# Patient Record
Sex: Female | Born: 1965 | Race: White | Hispanic: No | Smoking: Former smoker
Health system: Southern US, Community
[De-identification: ages and names within clinical notes are randomized; demographics above are authoritative.]

## PROBLEM LIST (undated history)

## (undated) DIAGNOSIS — IMO0002 Reserved for concepts with insufficient information to code with codable children: Secondary | ICD-10-CM

## (undated) DIAGNOSIS — F419 Anxiety disorder, unspecified: Secondary | ICD-10-CM

## (undated) DIAGNOSIS — K219 Gastro-esophageal reflux disease without esophagitis: Secondary | ICD-10-CM

## (undated) DIAGNOSIS — M1990 Unspecified osteoarthritis, unspecified site: Secondary | ICD-10-CM

## (undated) DIAGNOSIS — F32A Depression, unspecified: Secondary | ICD-10-CM

## (undated) DIAGNOSIS — D649 Anemia, unspecified: Secondary | ICD-10-CM

## (undated) DIAGNOSIS — H269 Unspecified cataract: Secondary | ICD-10-CM

## (undated) DIAGNOSIS — T7840XA Allergy, unspecified, initial encounter: Secondary | ICD-10-CM

## (undated) DIAGNOSIS — R011 Cardiac murmur, unspecified: Secondary | ICD-10-CM

## (undated) DIAGNOSIS — F1910 Other psychoactive substance abuse, uncomplicated: Secondary | ICD-10-CM

## (undated) HISTORY — DX: Unspecified cataract: H26.9

## (undated) HISTORY — DX: Reserved for concepts with insufficient information to code with codable children: IMO0002

## (undated) HISTORY — DX: Cardiac murmur, unspecified: R01.1

## (undated) HISTORY — DX: Other psychoactive substance abuse, uncomplicated: F19.10

## (undated) HISTORY — DX: Allergy, unspecified, initial encounter: T78.40XA

## (undated) HISTORY — PX: ABDOMINAL HYSTERECTOMY: SHX81

---

## 1999-07-04 ENCOUNTER — Encounter: Payer: Self-pay | Admitting: Emergency Medicine

## 1999-07-04 ENCOUNTER — Inpatient Hospital Stay (HOSPITAL_COMMUNITY): Admission: EM | Admit: 1999-07-04 | Discharge: 1999-07-08 | Payer: Self-pay | Admitting: Emergency Medicine

## 1999-07-05 ENCOUNTER — Encounter: Payer: Self-pay | Admitting: Gastroenterology

## 1999-07-06 ENCOUNTER — Encounter: Payer: Self-pay | Admitting: Gastroenterology

## 1999-07-20 ENCOUNTER — Encounter: Admission: RE | Admit: 1999-07-20 | Discharge: 1999-07-20 | Payer: Self-pay | Admitting: Gastroenterology

## 1999-07-20 ENCOUNTER — Encounter: Payer: Self-pay | Admitting: Gastroenterology

## 2001-08-12 ENCOUNTER — Encounter: Payer: Self-pay | Admitting: Emergency Medicine

## 2001-08-13 ENCOUNTER — Inpatient Hospital Stay (HOSPITAL_COMMUNITY): Admission: EM | Admit: 2001-08-13 | Discharge: 2001-08-15 | Payer: Self-pay | Admitting: Emergency Medicine

## 2007-04-10 ENCOUNTER — Emergency Department (HOSPITAL_COMMUNITY): Admission: EM | Admit: 2007-04-10 | Discharge: 2007-04-10 | Payer: Self-pay | Admitting: Emergency Medicine

## 2007-05-06 ENCOUNTER — Emergency Department (HOSPITAL_COMMUNITY): Admission: EM | Admit: 2007-05-06 | Discharge: 2007-05-06 | Payer: Self-pay | Admitting: Emergency Medicine

## 2007-06-12 ENCOUNTER — Ambulatory Visit (HOSPITAL_COMMUNITY): Admission: RE | Admit: 2007-06-12 | Discharge: 2007-06-12 | Payer: Self-pay | Admitting: Surgery

## 2007-06-12 ENCOUNTER — Encounter (INDEPENDENT_AMBULATORY_CARE_PROVIDER_SITE_OTHER): Payer: Self-pay | Admitting: Surgery

## 2008-04-20 ENCOUNTER — Encounter: Admission: RE | Admit: 2008-04-20 | Discharge: 2008-04-20 | Payer: Self-pay | Admitting: Family Medicine

## 2009-08-16 ENCOUNTER — Emergency Department (HOSPITAL_COMMUNITY): Admission: EM | Admit: 2009-08-16 | Discharge: 2009-08-17 | Payer: Self-pay | Admitting: Emergency Medicine

## 2009-09-08 ENCOUNTER — Emergency Department (HOSPITAL_COMMUNITY): Admission: EM | Admit: 2009-09-08 | Discharge: 2009-09-08 | Payer: Self-pay | Admitting: Family Medicine

## 2010-04-21 LAB — POCT URINALYSIS DIPSTICK
Ketones, ur: 15 mg/dL — AB
Nitrite: NEGATIVE
Specific Gravity, Urine: >=1.03 (ref 1.005–1.030)
Urobilinogen, UA: 0.2 mg/dL (ref 0.0–1.0)
pH: 5 (ref 5.0–8.0)

## 2010-04-23 LAB — URINALYSIS, ROUTINE W REFLEX MICROSCOPIC: Nitrite: NEGATIVE

## 2010-06-20 NOTE — Op Note (Signed)
Shawna Mcbride, Shawna Mcbride                 ACCOUNT NO.:  [id number]   MEDICAL RECORD NO.:  [id number]          PATIENT TYPE:  AMB   LOCATION:  DAY                          FACILITY:  [facility]   PHYSICIAN:  Sandria Bales. Ezzard Standing, M.D.  DATE OF BIRTH:  01/06/1966   DATE OF PROCEDURE:  DATE OF DISCHARGE:                               OPERATIVE REPORT   Date of Surgery ???   PREOPERATIVE DIAGNOSIS:  Gallbladder sludge with thickened gallbladder  wall.   POSTOPERATIVE DIAGNOSIS:  Significant inflammatory mass around  pylorus/duodenum with large pyloric channel ulcer.  No acute disease of  gallbladder.   OPERATION/PROCEDURE:  1. Diagnostic laparoscopy with biopsy of anterior wall of stomach and      peritoneum, peritoneal washings.  2. Upper endoscopy.   SURGEON:  Sandria Bales. Ezzard Standing, M.D.   ASSISTANT:  Anselm Pancoast. Zachery Dakins, M.D.   ANESTHESIA:  General endotracheal anesthesia.   ESTIMATED BLOOD LOSS:  Minimal.   INDICATIONS:  Shawna Mcbride is a 45 year old white female who is a patient  of Dr. Maurice Small who had gone to the emergency room  for some right  upper quadrant pain. An ultrasound at that time showed some possible  gall bladder sludge.  Significantly, the patient had a prior history of  peptic ulcer disease diagnosed over 10 years ago by Dr. Charna Elizabeth.  She takes over-the-counter Pepcid, but did not seem to have any clear  ulcer symptoms coincidental with her right upper quadrant pain.   I discussed with her gallbladder surgery and potential complications.  Potential complications of gallbladder surgery include, but are not  limited to, bleeding, infection, bile duct injury, and open surgery.   DESCRIPTION OF PROCEDURE:  The patient was placed in the supine  position, given a general endotracheal anesthetic. Her abdomen was  prepped with Techni-Care and sterilely draped.  A time out was held  identifying the patient and the procedure.   An infraumbilical incision was made with sharp  dissection and carried  down to the abdominal cavity.  I placed a 10 mm laparoscope through a 12  mm Hasson trocar was secured with a 0 Vicryl suture.  I placed a 5 mm  right lateral trocar as my only port.   She clearly had something going on other than gall bladder disease.  She  had an inflammatory mass around her distal pylorus and proximal  duodenum.  It was difficult to tell from the outside whether this was a  malignant change or some inflammatory change.  She has some nodules  along the anterior wall of her stomach which looked like saponification.  I biopsied of one of the nodules along the anterior wall of the stomach  and Dr. Baruch Merl thought it was giant cell reaction.  There was no  evidence of malignancy consistent with probably inflammatory changes.  A  second biopsy of the peritoneum on the edge of the liver were sent for  permanent and I also did the peritoneal washings.  I explored her  abdomen and right and left lobes of the liver were unremarkable.  The  gallbladder that I could see was actually unremarkable though the neck  of the gallbladder went down towards this inflammatory mass.  I actually  could see a lot of her bowel without much difficulty.  I looked down in  her pelvis.  I was able to see her right tube and ovary.  Her uterus is  absent.  She has no peritoneal nodules, mass or inflammation or fluid.   It was my feeling that the inflammatory mass probably represented  significant (but under diagnosed) peptic ulcer disease.   I broke scrub and did upper endoscopy where I passed the flexible  Olympus endoscope without difficulty into the stomach.  In her distal  stomach in the antrum towards the pylorus, she has a very enlarged,  probably 2+ cm ulcer on the antral wall of the stomach.  Immediately  proximal to that shows a small ulcer, maybe around 1 cm in size.  These  ulcers had a benign appearance, I think goes along with her poorly  treated or untreated  ulcer disease and her continued smoking.   I thought there was no reason to try to doing any gallbladder surgery  today.  Her gallbladder is not acutely inflamed.  There would be  significant risk with the surgery in trying to dissect this out in the  face of the inflammation.  I think what she needs is intense medical  therapy with followup from the GI doctor for the ulcer disease. If this  responds to medical therapy, then we can approach her gallbladder at a  later date.  If it does not respond to therapy, she may well need  surgery to control ulcer disease.   I will explain all this to her postoperatively.  I then removed the  trocars.  I closed the umbilical incision with 0 Vicryl suture, closed  the skin incision with 5-0 Monocryl suture. The abdomen was closed with  Benzoin and Steri-Strips.  The patient tolerated the procedure well and  was transferred to recovery in good condition.      Sandria Bales. Ezzard Standing, M.D.  Electronically Signed     DHN/MEDQ  D:  06/12/2007  T:  06/12/2007  Job:  [id number]   cc:   Gretta Arab. Valentina Lucks, M.D.  Fax: [phone]   WGNFAO ZHY QMVH, M.D.  Fax: [phone]
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
## 2010-06-23 NOTE — Discharge Summary (Signed)
[place]  Patient:    Shawna Mcbride, Shawna Mcbride                        MRN: [id number] Adm. Date:  [id number] Disc. Date: [id number] Attending:  Charna Elizabeth CC:         Anselmo Rod, M.D.             Velora Heckler, M.D.                           Discharge Summary  ADMITTING DIAGNOSES: 1. Partial small bowel obstruction (distal). 2. History of duodenal ulcer in the past. 3. History of right ovarian cyst. 4. Status post vaginal hysterectomy in 1997, for dysfunctional uterine    bleeding.  DISCHARGE DIAGNOSES: 1. Partial small bowel obstruction resolved. 2. History of duodenal ulcer. 3. History of right ovarian cyst. 4. Status post vaginal hysterectomy in 1997, for dysfunctional uterine    bleeding.  HOSPITAL COURSE:  Ms. [name] is a 45 year old white female who was admitted to [facility] through the emergency room on Jul 04, 1999, when she presented via ambulance with severe abdominal pain associated with nausea and vomiting.  The patient was evaluated in the emergency room and was found to have distal small bowel obstruction.  She also complained of constipation at the time.  She was admitted and NG tube was placed.  She was started on IV fluids and electrolytes were closely monitored.  Serial abdominal films were done and the patient was followed closely through the admission.  She was seen in consultation by Dr. Darnell Level on Jul 06, 1999, for possible surgery if symptoms not improved within the next 24-48 hours. However, the patient did well and was discharged home on Jul 06, 1999. For details of admission, please see History and Physical.  FOLLOWUP:  Follow up in the office in the next week and to call the office emergently if she has recurrence of her symptoms.  DIET:  Soft diet is recommended and gradually advance as tolerated. DD:  08/15/99 TD:  08/15/99 Job: [id number]

## 2010-08-10 ENCOUNTER — Inpatient Hospital Stay (INDEPENDENT_AMBULATORY_CARE_PROVIDER_SITE_OTHER)
Admission: RE | Admit: 2010-08-10 | Discharge: 2010-08-10 | Disposition: A | Discharge status: Home / Self Care | Payer: Self-pay | Source: Ambulatory Visit | Attending: Family Medicine | Admitting: Family Medicine

## 2010-08-10 DIAGNOSIS — J029 Acute pharyngitis, unspecified: Secondary | ICD-10-CM

## 2010-08-10 LAB — POCT RAPID STREP A: Streptococcus, Group A Screen (Direct): NEGATIVE

## 2010-10-30 LAB — URINALYSIS, ROUTINE W REFLEX MICROSCOPIC
Bilirubin Urine: NEGATIVE
Glucose, UA: NEGATIVE
Hgb urine dipstick: NEGATIVE
Ketones, ur: >80 — AB
Ketones, ur: NEGATIVE
Nitrite: NEGATIVE
Protein, ur: NEGATIVE
Protein, ur: NEGATIVE
Specific Gravity, Urine: 1.027
Specific Gravity, Urine: 1.013
Urobilinogen, UA: 0.2
Urobilinogen, UA: 0.2
pH: 5.5

## 2010-10-30 LAB — LIPASE, BLOOD: Lipase: 13

## 2010-10-30 LAB — DIFFERENTIAL
Basophils Relative: 1
Eosinophils Absolute: 0.1
Eosinophils Relative: 1
Lymphs Abs: 1.1

## 2010-10-30 LAB — COMPREHENSIVE METABOLIC PANEL
AST: 24
Albumin: 3.5
Alkaline Phosphatase: 70
BUN: 13
CO2: 28
Chloride: 99
GFR calc Af Amer: >60
Potassium: 4.3
Total Bilirubin: 0.5

## 2010-10-30 LAB — CBC
MCV: 91.4
Platelets: 348

## 2011-01-01 ENCOUNTER — Inpatient Hospital Stay (HOSPITAL_COMMUNITY)
Admission: EM | Admit: 2011-01-01 | Discharge: 2011-01-04 | DRG: 378 | Disposition: A | Discharge status: Home / Self Care | Payer: Self-pay | Source: Ambulatory Visit | Attending: Internal Medicine | Admitting: Internal Medicine

## 2011-01-01 ENCOUNTER — Other Ambulatory Visit: Payer: Self-pay

## 2011-01-01 ENCOUNTER — Encounter: Payer: Self-pay | Admitting: *Deleted

## 2011-01-01 DIAGNOSIS — D500 Iron deficiency anemia secondary to blood loss (chronic): Secondary | ICD-10-CM | POA: Diagnosis present

## 2011-01-01 DIAGNOSIS — K922 Gastrointestinal hemorrhage, unspecified: Secondary | ICD-10-CM

## 2011-01-01 DIAGNOSIS — K254 Chronic or unspecified gastric ulcer with hemorrhage: Principal | ICD-10-CM | POA: Diagnosis present

## 2011-01-01 DIAGNOSIS — F17200 Nicotine dependence, unspecified, uncomplicated: Secondary | ICD-10-CM | POA: Diagnosis present

## 2011-01-01 DIAGNOSIS — M129 Arthropathy, unspecified: Secondary | ICD-10-CM | POA: Diagnosis present

## 2011-01-01 DIAGNOSIS — D696 Thrombocytopenia, unspecified: Secondary | ICD-10-CM | POA: Diagnosis present

## 2011-01-01 DIAGNOSIS — D62 Acute posthemorrhagic anemia: Secondary | ICD-10-CM | POA: Diagnosis present

## 2011-01-01 DIAGNOSIS — D72829 Elevated white blood cell count, unspecified: Secondary | ICD-10-CM | POA: Diagnosis present

## 2011-01-01 DIAGNOSIS — E876 Hypokalemia: Secondary | ICD-10-CM | POA: Diagnosis present

## 2011-01-01 DIAGNOSIS — Z79899 Other long term (current) drug therapy: Secondary | ICD-10-CM

## 2011-01-01 DIAGNOSIS — Z681 Body mass index (BMI) 19 or less, adult: Secondary | ICD-10-CM

## 2011-01-01 HISTORY — DX: Unspecified osteoarthritis, unspecified site: M19.90

## 2011-01-01 LAB — OCCULT BLOOD, POC DEVICE: Fecal Occult Bld: POSITIVE

## 2011-01-01 MED ORDER — ONDANSETRON HCL 4 MG/2ML IJ SOLN
4.0000 mg | Freq: Once | INTRAMUSCULAR | Status: AC
  Administered 2011-01-01: 4 mg via INTRAVENOUS
  Filled 2011-01-01: qty 2

## 2011-01-01 MED ORDER — SODIUM CHLORIDE 0.9 % IV BOLUS (SEPSIS)
1000.0000 mL | Freq: Once | INTRAVENOUS | Status: AC
  Administered 2011-01-02: 1000 mL via INTRAVENOUS

## 2011-01-01 MED ORDER — SODIUM CHLORIDE 0.9 % IV SOLN
8.0000 mg/h | INTRAVENOUS | Status: AC
  Administered 2011-01-02: 8 mg/h via INTRAVENOUS
  Filled 2011-01-01: qty 80

## 2011-01-01 MED ORDER — SODIUM CHLORIDE 0.9 % IV SOLN
80.0000 mg | INTRAVENOUS | Status: AC
  Administered 2011-01-02: 80 mg via INTRAVENOUS
  Filled 2011-01-01: qty 80

## 2011-01-01 MED ORDER — SODIUM CHLORIDE 0.9 % IV BOLUS (SEPSIS)
1000.0000 mL | Freq: Once | INTRAVENOUS | Status: AC
  Administered 2011-01-01: 1000 mL via INTRAVENOUS

## 2011-01-01 NOTE — ED Notes (Signed)
Pt undressed and placed in gown. Pt is on cardiac monitor, pulse ox, and bp cuff

## 2011-01-01 NOTE — ED Notes (Signed)
Patient presents with c/o dark tarry stool, +vomiting, possible syncopal episode.  IV right AC 20gauge with NS

## 2011-01-01 NOTE — ED Provider Notes (Signed)
This 45 year old female has new onset upper GI bleeding with hematemesis of maroon emesis and dark stool just this evening with no abdominal pain or tenderness.  Hurman Horn, MD 01/02/11 [phone]

## 2011-01-01 NOTE — ED Notes (Signed)
Patient stated she felt weak all all day.  She went to the BR and had a loose tarry stool, and on the way back to her bed she had a syncopal episode and remembers her son helping up to the bed.  Then she began vomiting dark bloody emesis with streaks of bright red blood.  States she ha a history of ulcers but has not had any trouble with them for about 2 years.

## 2011-01-02 ENCOUNTER — Encounter (HOSPITAL_COMMUNITY)
Admission: EM | Disposition: A | Discharge status: Home / Self Care | Payer: Self-pay | Source: Ambulatory Visit | Attending: Internal Medicine

## 2011-01-02 ENCOUNTER — Other Ambulatory Visit: Payer: Self-pay | Admitting: Gastroenterology

## 2011-01-02 ENCOUNTER — Encounter (HOSPITAL_COMMUNITY): Payer: Self-pay | Admitting: *Deleted

## 2011-01-02 DIAGNOSIS — D500 Iron deficiency anemia secondary to blood loss (chronic): Secondary | ICD-10-CM | POA: Diagnosis present

## 2011-01-02 DIAGNOSIS — D72829 Elevated white blood cell count, unspecified: Secondary | ICD-10-CM | POA: Diagnosis present

## 2011-01-02 HISTORY — PX: ESOPHAGOGASTRODUODENOSCOPY: SHX5428

## 2011-01-02 LAB — CBC
HCT: 31.6 % — ABNORMAL LOW (ref 36.0–46.0)
HCT: 27.1 % — ABNORMAL LOW (ref 36.0–46.0)
HCT: 27.2 % — ABNORMAL LOW (ref 36.0–46.0)
HCT: 23.9 % — ABNORMAL LOW (ref 36.0–46.0)
Hemoglobin: 10.6 g/dL — ABNORMAL LOW (ref 12.0–15.0)
Hemoglobin: 7.9 g/dL — ABNORMAL LOW (ref 12.0–15.0)
MCH: 31.3 pg (ref 26.0–34.0)
MCH: 31 pg (ref 26.0–34.0)
MCHC: 33.5 g/dL (ref 30.0–36.0)
MCHC: 33.1 g/dL (ref 30.0–36.0)
MCV: 93.2 fL (ref 78.0–100.0)
MCV: 93.5 fL (ref 78.0–100.0)
MCV: 94.4 fL (ref 78.0–100.0)
Platelets: 197 10*3/uL (ref 150–400)
Platelets: 169 10*3/uL (ref 150–400)
Platelets: 173 10*3/uL (ref 150–400)
RBC: 3.39 MIL/uL — ABNORMAL LOW (ref 3.87–5.11)
RBC: 2.87 MIL/uL — ABNORMAL LOW (ref 3.87–5.11)
RBC: 2.91 MIL/uL — ABNORMAL LOW (ref 3.87–5.11)
WBC: 6.4 10*3/uL (ref 4.0–10.5)

## 2011-01-02 LAB — PROTIME-INR
INR: 1.08 (ref 0.00–1.49)
INR: 1.02 (ref 0.00–1.49)

## 2011-01-02 LAB — COMPREHENSIVE METABOLIC PANEL
AST: 10 U/L (ref 0–37)
AST: 12 U/L (ref 0–37)
Albumin: 3 g/dL — ABNORMAL LOW (ref 3.5–5.2)
Alkaline Phosphatase: 37 U/L — ABNORMAL LOW (ref 39–117)
Alkaline Phosphatase: 8 U/L — ABNORMAL LOW (ref 39–117)
BUN: 48 mg/dL — ABNORMAL HIGH (ref 6–23)
CO2: 19 mEq/L (ref 19–32)
Calcium: UNDETERMINED mg/dL (ref 8.4–10.5)
Chloride: 101 mEq/L (ref 96–112)
Chloride: 111 mEq/L (ref 96–112)
Creatinine, Ser: 0.51 mg/dL (ref 0.50–1.10)
Glucose, Bld: 86 mg/dL (ref 70–99)
Potassium: 3.5 mEq/L (ref 3.5–5.1)
Total Bilirubin: 0.2 mg/dL — ABNORMAL LOW (ref 0.3–1.2)
Total Bilirubin: 0.3 mg/dL (ref 0.3–1.2)
Total Protein: 5.4 g/dL — ABNORMAL LOW (ref 6.0–8.3)
Total Protein: 6.1 g/dL (ref 6.0–8.3)

## 2011-01-02 LAB — DIFFERENTIAL
Eosinophils Absolute: 0.1 10*3/uL (ref 0.0–0.7)
Lymphs Abs: 1.8 10*3/uL (ref 0.7–4.0)

## 2011-01-02 LAB — ABO/RH: ABO/RH(D): B POS

## 2011-01-02 LAB — MAGNESIUM: Magnesium: 1.8 mg/dL (ref 1.5–2.5)

## 2011-01-02 LAB — LACTIC ACID, PLASMA: Lactic Acid, Venous: 0.6 mmol/L (ref 0.5–2.2)

## 2011-01-02 SURGERY — EGD (ESOPHAGOGASTRODUODENOSCOPY)
Anesthesia: Moderate Sedation

## 2011-01-02 MED ORDER — ACETAMINOPHEN 650 MG RE SUPP: 650.0000 mg | Freq: Four times a day (QID) | RECTAL | Status: DC | PRN

## 2011-01-02 MED ORDER — ONDANSETRON HCL 4 MG/2ML IJ SOLN
4.0000 mg | Freq: Four times a day (QID) | INTRAMUSCULAR | Status: DC | PRN
  Administered 2011-01-02: 4 mg via INTRAVENOUS
  Filled 2011-01-02: qty 2

## 2011-01-02 MED ORDER — FENTANYL NICU IV SYRINGE 50 MCG/ML
INJECTION | INTRAMUSCULAR | Status: DC | PRN
  Administered 2011-01-02 (×3): 25 ug via INTRAVENOUS

## 2011-01-02 MED ORDER — SENNOSIDES-DOCUSATE SODIUM 8.6-50 MG PO TABS
1.0000 | ORAL_TABLET | Freq: Every day | ORAL | Status: DC | PRN
Start: 2011-01-02 — End: 2011-01-04
  Filled 2011-01-02: qty 1

## 2011-01-02 MED ORDER — DIPHENHYDRAMINE HCL 50 MG/ML IJ SOLN
INTRAMUSCULAR | Status: DC | PRN
  Administered 2011-01-02 (×2): 25 mg via INTRAVENOUS

## 2011-01-02 MED ORDER — HYDROCODONE-ACETAMINOPHEN 5-325 MG PO TABS
1.0000 | ORAL_TABLET | ORAL | Status: DC | PRN
  Administered 2011-01-02: 1 via ORAL
  Administered 2011-01-02 – 2011-01-03 (×6): 2 via ORAL
  Administered 2011-01-04 (×2): 1 via ORAL
  Filled 2011-01-02 (×3): qty 2
  Filled 2011-01-02: qty 1
  Filled 2011-01-02 (×4): qty 2
  Filled 2011-01-02: qty 1

## 2011-01-02 MED ORDER — ACETAMINOPHEN 325 MG PO TABS: 650.0000 mg | ORAL_TABLET | Freq: Four times a day (QID) | ORAL | Status: DC | PRN

## 2011-01-02 MED ORDER — ENOXAPARIN SODIUM 40 MG/0.4ML ~~LOC~~ SOLN: 40.0000 mg | SUBCUTANEOUS | Status: DC

## 2011-01-02 MED ORDER — HYDROCODONE-ACETAMINOPHEN 5-325 MG PO TABS
1.0000 | ORAL_TABLET | Freq: Once | ORAL | Status: AC
  Administered 2011-01-02: 1 via ORAL
  Filled 2011-01-02: qty 1

## 2011-01-02 MED ORDER — FENTANYL CITRATE 0.05 MG/ML IJ SOLN
INTRAMUSCULAR | Status: AC
  Filled 2011-01-02: qty 2

## 2011-01-02 MED ORDER — PANTOPRAZOLE SODIUM 40 MG IV SOLR
INTRAVENOUS | Status: AC
  Filled 2011-01-02: qty 80

## 2011-01-02 MED ORDER — ONDANSETRON HCL 4 MG PO TABS
4.0000 mg | ORAL_TABLET | Freq: Four times a day (QID) | ORAL | Status: DC | PRN
  Administered 2011-01-03 (×2): 4 mg via ORAL
  Filled 2011-01-02 (×2): qty 1

## 2011-01-02 MED ORDER — MIDAZOLAM HCL 10 MG/2ML IJ SOLN
INTRAMUSCULAR | Status: AC
  Filled 2011-01-02: qty 2

## 2011-01-02 MED ORDER — NICOTINE 21 MG/24HR TD PT24
21.0000 mg | MEDICATED_PATCH | Freq: Every day | TRANSDERMAL | Status: DC
  Administered 2011-01-02 – 2011-01-04 (×4): 21 mg via TRANSDERMAL
  Filled 2011-01-02 (×3): qty 1

## 2011-01-02 MED ORDER — POLYETHYLENE GLYCOL 3350 17 G PO PACK
17.0000 g | PACK | Freq: Every day | ORAL | Status: DC
  Administered 2011-01-03 – 2011-01-04 (×2): 17 g via ORAL
  Filled 2011-01-02 (×4): qty 1

## 2011-01-02 MED ORDER — PANTOPRAZOLE SODIUM 40 MG PO TBEC
40.0000 mg | DELAYED_RELEASE_TABLET | Freq: Every day | ORAL | Status: DC
  Administered 2011-01-02 – 2011-01-04 (×3): 40 mg via ORAL
  Filled 2011-01-02 (×5): qty 1

## 2011-01-02 MED ORDER — MIDAZOLAM HCL 10 MG/2ML IJ SOLN
INTRAMUSCULAR | Status: DC | PRN
  Administered 2011-01-02 (×2): 2 mg via INTRAVENOUS
  Administered 2011-01-02 (×2): 1 mg via INTRAVENOUS

## 2011-01-02 MED ORDER — SUCRALFATE 1 G PO TABS
1.0000 g | ORAL_TABLET | Freq: Three times a day (TID) | ORAL | Status: DC
  Administered 2011-01-02 – 2011-01-04 (×8): 1 g via ORAL
  Filled 2011-01-02 (×10): qty 1

## 2011-01-02 MED ORDER — DIPHENHYDRAMINE HCL 50 MG/ML IJ SOLN
INTRAMUSCULAR | Status: AC
  Filled 2011-01-02: qty 1

## 2011-01-02 MED ORDER — BUTAMBEN-TETRACAINE-BENZOCAINE 2-2-14 % EX AERO
INHALATION_SPRAY | CUTANEOUS | Status: DC | PRN
  Administered 2011-01-02 (×2): 1 via TOPICAL

## 2011-01-02 MED ORDER — SODIUM CHLORIDE 0.9 % IV SOLN
INTRAVENOUS | Status: DC
  Administered 2011-01-02 (×2): via INTRAVENOUS

## 2011-01-02 NOTE — ED Provider Notes (Signed)
I saw and evaluated the patient, reviewed the resident's note and Pt's ECG and I agree with the findings and plan.  Hurman Horn, MD 01/02/11 [phone]

## 2011-01-02 NOTE — Progress Notes (Signed)
01/02/2011 Shawna Mcbride Case Management Note [phone]       45yo female patient admitted on 01/01/11 with c/o dark stools and hematemesis. Only history is arthritis. In to speak with patient. Prior to admission, patient lived at home with adult son. Independent with ADLs. No discharge needs noted at present. CM will continue to monitor.

## 2011-01-02 NOTE — Progress Notes (Signed)
Phone to Dr. Verta Ellen.  Notified that Dr. Kirby Funk is not pts MD, instead his wife Maurice Small, MD, and he request service be changed to hospitalist for coverage. Dr. Verta Ellen reported she would facilitate change in system.

## 2011-01-02 NOTE — Progress Notes (Addendum)
Subjective: No further episodes of hematemesis or melena during this hospitalization, blood pressure continues to be low  Objective: Vital signs in last 24 hours: Filed Vitals:   01/02/11 0218 01/02/11 0530 01/02/11 0700 01/02/11 1000  BP: 88/54 97/66  98/59  Pulse: 67 56  59  Temp: 97.3 F (36.3 C) 97.8 F (36.6 C)  98.3 F (36.8 C)  TempSrc: Oral Oral  Oral  Resp: 20 18  18   Height:   5\' 7"  (1.702 m)   Weight:   52.1 kg (114 lb 13.8 oz)   SpO2: 100% 100%  97%    Intake/Output Summary (Last 24 hours) at 01/02/11 1118 Last data filed at 01/02/11 0900  Gross per 24 hour  Intake    240 ml  Output      0 ml  Net    240 ml    Weight change:   General: Alert, awake, oriented x3, in no acute distress. HEENT: No bruits, no goiter. Heart: Regular rate and rhythm, without murmurs, rubs, gallops. Lungs: Clear to auscultation bilaterally. Abdomen: Soft, nontender, nondistended, positive bowel sounds. Extremities: No clubbing cyanosis or edema with positive pedal pulses. Neuro: Grossly intact, nonfocal.    Lab Results: Results for orders placed during the hospital encounter of 01/01/11 (from the past 24 hour(s))  OCCULT BLOOD, POC DEVICE     Status: Normal   Collection Time   01/01/11 10:48 PM      Component Value Range   Fecal Occult Bld POSITIVE    CBC     Status: Abnormal   Collection Time   01/01/11 11:22 PM      Component Value Range   WBC 14.2 (*) 4.0 - 10.5 (K/uL)   RBC 3.39 (*) 3.87 - 5.11 (MIL/uL)   Hemoglobin 10.6 (*) 12.0 - 15.0 (g/dL)   HCT 16.1 (*) 09.6 - 46.0 (%)   MCV 93.2  78.0 - 100.0 (fL)   MCH 31.3  26.0 - 34.0 (pg)   MCHC 33.5  30.0 - 36.0 (g/dL)   RDW 04.5  40.9 - 81.1 (%)   Platelets 197  150 - 400 (K/uL)  DIFFERENTIAL     Status: Abnormal   Collection Time   01/01/11 11:22 PM      Component Value Range   Neutrophils Relative 82 (*) 43 - 77 (%)   Neutro Abs 11.6 (*) 1.7 - 7.7 (K/uL)   Lymphocytes Relative 13  12 - 46 (%)   Lymphs Abs 1.8   0.7 - 4.0 (K/uL)   Monocytes Relative 5  3 - 12 (%)   Monocytes Absolute 0.7  0.1 - 1.0 (K/uL)   Eosinophils Relative 1  0 - 5 (%)   Eosinophils Absolute 0.1  0.0 - 0.7 (K/uL)   Basophils Relative 0  0 - 1 (%)   Basophils Absolute 0.0  0.0 - 0.1 (K/uL)  COMPREHENSIVE METABOLIC PANEL     Status: Abnormal   Collection Time   01/01/11 11:22 PM      Component Value Range   Sodium 135  135 - 145 (mEq/L)   Potassium SPECIMEN CONTAMINATED, UNABLE TO PERFORM TEST(S).  3.5 - 5.1 (mEq/L)   Chloride 101  96 - 112 (mEq/L)   CO2 19  19 - 32 (mEq/L)   Glucose, Bld 86  70 - 99 (mg/dL)   BUN 48 (*) 6 - 23 (mg/dL)   Creatinine, Ser 9.14  0.50 - 1.10 (mg/dL)   Calcium SPECIMEN CONTAMINATED, UNABLE TO PERFORM TEST(S).  8.4 -  10.5 (mg/dL)   Total Protein 6.1  6.0 - 8.3 (g/dL)   Albumin 3.5  3.5 - 5.2 (g/dL)   AST 12  0 - 37 (U/L)   ALT 9  0 - 35 (U/L)   Alkaline Phosphatase 8 (*) 39 - 117 (U/L)   Total Bilirubin 0.2 (*) 0.3 - 1.2 (mg/dL)   GFR calc non Af Amer >90  >90 (mL/min)   GFR calc Af Amer >90  >90 (mL/min)  PROTIME-INR     Status: Normal   Collection Time   01/01/11 11:22 PM      Component Value Range   Prothrombin Time 14.2  11.6 - 15.2 (seconds)   INR 1.08  0.00 - 1.49   TYPE AND SCREEN     Status: Normal   Collection Time   01/01/11 11:31 PM      Component Value Range   ABO/RH(D) B POS     Antibody Screen NEG     Sample Expiration 01/04/2011    LACTIC ACID, PLASMA     Status: Normal   Collection Time   01/01/11 11:32 PM      Component Value Range   Lactic Acid, Venous 0.6  0.5 - 2.2 (mmol/L)  ABO/RH     Status: Normal   Collection Time   01/01/11 11:32 PM      Component Value Range   ABO/RH(D) B POS    COMPREHENSIVE METABOLIC PANEL     Status: Abnormal   Collection Time   01/02/11 12:40 AM      Component Value Range   Sodium 140  135 - 145 (mEq/L)   Potassium 3.5  3.5 - 5.1 (mEq/L)   Chloride 111  96 - 112 (mEq/L)   CO2 23  19 - 32 (mEq/L)   Glucose, Bld 92  70 - 99  (mg/dL)   BUN 43 (*) 6 - 23 (mg/dL)   Creatinine, Ser 1.61  0.50 - 1.10 (mg/dL)   Calcium 7.5 (*) 8.4 - 10.5 (mg/dL)   Total Protein 5.4 (*) 6.0 - 8.3 (g/dL)   Albumin 3.0 (*) 3.5 - 5.2 (g/dL)   AST 10  0 - 37 (U/L)   ALT 8  0 - 35 (U/L)   Alkaline Phosphatase 37 (*) 39 - 117 (U/L)   Total Bilirubin 0.3  0.3 - 1.2 (mg/dL)   GFR calc non Af Amer >90  >90 (mL/min)   GFR calc Af Amer >90  >90 (mL/min)  GLUCOSE, CAPILLARY     Status: Normal   Collection Time   01/02/11  5:39 AM      Component Value Range   Glucose-Capillary 95  70 - 99 (mg/dL)  GLUCOSE, CAPILLARY     Status: Normal   Collection Time   01/02/11  7:47 AM      Component Value Range   Glucose-Capillary 94  70 - 99 (mg/dL)  CBC     Status: Abnormal   Collection Time   01/02/11 10:15 AM      Component Value Range   WBC 6.3  4.0 - 10.5 (K/uL)   RBC 2.87 (*) 3.87 - 5.11 (MIL/uL)   Hemoglobin 8.9 (*) 12.0 - 15.0 (g/dL)   HCT 09.6 (*) 04.5 - 46.0 (%)   MCV 94.4  78.0 - 100.0 (fL)   MCH 31.0  26.0 - 34.0 (pg)   MCHC 32.8  30.0 - 36.0 (g/dL)   RDW 40.9  81.1 - 91.4 (%)   Platelets 169  150 - 400 (K/uL)  MAGNESIUM     Status: Normal   Collection Time   01/02/11 10:15 AM      Component Value Range   Magnesium 1.8  1.5 - 2.5 (mg/dL)  PHOSPHORUS     Status: Normal   Collection Time   01/02/11 10:15 AM      Component Value Range   Phosphorus 2.9  2.3 - 4.6 (mg/dL)  APTT     Status: Normal   Collection Time   01/02/11 10:15 AM      Component Value Range   aPTT 33  24 - 37 (seconds)  PROTIME-INR     Status: Normal   Collection Time   01/02/11 10:15 AM      Component Value Range   Prothrombin Time 13.6  11.6 - 15.2 (seconds)   INR 1.02  0.00 - 1.49   CBC     Status: Abnormal   Collection Time   01/02/11 10:15 AM      Component Value Range   WBC 6.4  4.0 - 10.5 (K/uL)   RBC 2.91 (*) 3.87 - 5.11 (MIL/uL)   Hemoglobin 9.1 (*) 12.0 - 15.0 (g/dL)   HCT 81.1 (*) 91.4 - 46.0 (%)   MCV 93.5  78.0 - 100.0 (fL)   MCH  31.3  26.0 - 34.0 (pg)   MCHC 33.5  30.0 - 36.0 (g/dL)   RDW 78.2  95.6 - 21.3 (%)   Platelets 173  150 - 400 (K/uL)     Micro: No results found for this or any previous visit (from the past 240 hour(s)).  Studies/Results: No results found.  Medications:     . HYDROcodone-acetaminophen  1 tablet Oral Once  . nicotine  21 mg Transdermal Daily  . ondansetron (ZOFRAN) IV  4 mg Intravenous Once  . pantoprazole (PROTONIX) IV  80 mg Intravenous To Major  . pantoprozole (PROTONIX) infusion  8 mg/hr Intravenous To Major  . pantoprazole  40 mg Oral Q1200  . polyethylene glycol  17 g Oral Daily  . sodium chloride  1,000 mL Intravenous Once  . sodium chloride  1,000 mL Intravenous Once  . DISCONTD: enoxaparin  40 mg Subcutaneous Q24H    Assessment:  #1 hematemesis and melena. Will follow serial H&H. Dr.[facility]UCCINI from [facility]  GI has been consulted. Patient is currently n.p.o. And on a Protonix drip. egd TODAY  #2 leukocytosis likely stress margination  #3 nicotine dependence requested a nicotine patch.  Very anxious and tearful may sign out AGAINST MEDICAL ADVICE  LOS: 1 day   [facility] 01/02/2011, 11:18 AM

## 2011-01-02 NOTE — H&P (Addendum)
PCP:  Maurice Small  DOA:  01/01/2011  9:54 PM  Chief Complaint:  Dark stool and hematemesis  HPI: Pt is 45 yo female with no significant past medical history presents to [facility] ED with main concern of one episode of dark stool and one episode on hematemesis but pt is not able to quantify the amount. She denies similar episodes in the past. She denies recent sicknesses or hospitalizations, no episodes of chest pain or shortness of breath, no abdominal or urinary concerns. She reports smoking but denies active alcohol use. She denies episodes of hematemesis or hematochezia since coming to the ED. She also denies fevers and chills, denies changes in appetite no other systemic concerns. Patient reports taking Goody powders on certain occasions since that helps relieve her chronic headaches.  Allergies: No Known Allergies  Prior to Admission medications   Not on File    Past Medical History  Diagnosis Date  . Arthritis     Past Surgical History  Procedure Date  . Abdominal hysterectomy     Social History:  does not have a smoking history on file. She does not have any smokeless tobacco history on file. She reports that she drinks alcohol. She reports that she does not use illicit drugs.  History reviewed. No pertinent family history.  Review of Systems:  Constitutional: Denies fever, chills, diaphoresis, appetite change and fatigue.  HEENT: Denies photophobia, eye pain, redness, hearing loss, ear pain, congestion, sore throat, rhinorrhea, sneezing, mouth sores, trouble swallowing, neck pain, neck stiffness and tinnitus.   Respiratory: Denies SOB, DOE, cough, chest tightness,  and wheezing.   Cardiovascular: Denies chest pain, palpitations and leg swelling.  Genitourinary: Denies dysuria, urgency, frequency, hematuria, flank pain and difficulty urinating.  Musculoskeletal: Denies myalgias, back pain, joint swelling, arthralgias and gait problem.  Skin: Denies pallor, rash and wound.    Neurological: Denies dizziness, seizures, syncope, weakness, light-headedness, numbness and headaches.  Hematological: Denies adenopathy. Easy bruising, personal or family bleeding history  Psychiatric/Behavioral: Denies suicidal ideation, mood changes, confusion, nervousness, sleep disturbance and agitation   Physical Exam:  Filed Vitals:   01/01/11 2154 01/01/11 2215 01/02/11 0218  BP: 89/70 90/64 88/54   Pulse: 66 67 67  Temp: 97.5 F (36.4 C)  97.3 F (36.3 C)  TempSrc: Oral  Oral  Resp: 16 16 20   SpO2: 100% 100% 100%    Constitutional: Vital signs reviewed.  Patient is a well-developed and well-nourished in no acute distress and cooperative with exam. Alert and oriented x3.  Head: Normocephalic and atraumatic Ear: TM normal bilaterally Mouth: no erythema or exudates, MMM Eyes: PERRL, EOMI, conjunctivae normal, No scleral icterus.  Neck: Supple, Trachea midline normal ROM, No JVD, mass, thyromegaly, or carotid bruit present.  Cardiovascular: RRR, S1 normal, S2 normal, no MRG, pulses symmetric and intact bilaterally Pulmonary/Chest: CTAB, no wheezes, rales, or rhonchi Abdominal: Soft. Non-tender, non-distended, bowel sounds are normal, no masses, organomegaly, or guarding present.  GU: no CVA tenderness Musculoskeletal: No joint deformities, erythema, or stiffness, ROM full and no nontender Ext: no edema and no cyanosis, pulses palpable bilaterally (DP and PT) Hematology: no cervical, inginal, or axillary adenopathy.  Neurological: A&O x3, Strenght is normal and symmetric bilaterally, cranial nerve II-XII are grossly intact, no focal motor deficit, sensory intact to light touch bilaterally.  Skin: Warm, dry and intact. No rash, cyanosis, or clubbing.  Psychiatric: Normal mood and affect. speech and behavior is normal. Judgment and thought content normal. Cognition and memory are normal.   Labs  on Admission:  Results for orders placed during the hospital encounter of 01/01/11  (from the past 48 hour(s))  OCCULT BLOOD, POC DEVICE     Status: Normal   Collection Time   01/01/11 10:48 PM      Component Value Range Comment   Fecal Occult Bld POSITIVE     CBC     Status: Abnormal   Collection Time   01/01/11 11:22 PM      Component Value Range Comment   WBC 14.2 (*) 4.0 - 10.5 (K/uL)    RBC 3.39 (*) 3.87 - 5.11 (MIL/uL)    Hemoglobin 10.6 (*) 12.0 - 15.0 (g/dL)    HCT 16.1 (*) 09.6 - 46.0 (%)    MCV 93.2  78.0 - 100.0 (fL)    MCH 31.3  26.0 - 34.0 (pg)    MCHC 33.5  30.0 - 36.0 (g/dL)    RDW 04.5  40.9 - 81.1 (%)    Platelets 197  150 - 400 (K/uL)   DIFFERENTIAL     Status: Abnormal   Collection Time   01/01/11 11:22 PM      Component Value Range Comment   Neutrophils Relative 82 (*) 43 - 77 (%)    Neutro Abs 11.6 (*) 1.7 - 7.7 (K/uL)    Lymphocytes Relative 13  12 - 46 (%)    Lymphs Abs 1.8  0.7 - 4.0 (K/uL)    Monocytes Relative 5  3 - 12 (%)    Monocytes Absolute 0.7  0.1 - 1.0 (K/uL)    Eosinophils Relative 1  0 - 5 (%)    Eosinophils Absolute 0.1  0.0 - 0.7 (K/uL)    Basophils Relative 0  0 - 1 (%)    Basophils Absolute 0.0  0.0 - 0.1 (K/uL)   COMPREHENSIVE METABOLIC PANEL     Status: Abnormal   Collection Time   01/01/11 11:22 PM      Component Value Range Comment   Sodium 135  135 - 145 (mEq/L)    Potassium SPECIMEN CONTAMINATED, UNABLE TO PERFORM TEST(S).  3.5 - 5.1 (mEq/L) QUESTIONABLE RESULTS, RECOMMEND RECOLLECT TO VERIFY   Chloride 101  96 - 112 (mEq/L)    CO2 19  19 - 32 (mEq/L)    Glucose, Bld 86  70 - 99 (mg/dL)    BUN 48 (*) 6 - 23 (mg/dL)    Creatinine, Ser 9.14  0.50 - 1.10 (mg/dL)    Calcium SPECIMEN CONTAMINATED, UNABLE TO PERFORM TEST(S).  8.4 - 10.5 (mg/dL) QUESTIONABLE RESULTS, RECOMMEND RECOLLECT TO VERIFY   Total Protein 6.1  6.0 - 8.3 (g/dL)    Albumin 3.5  3.5 - 5.2 (g/dL)    AST 12  0 - 37 (U/L)    ALT 9  0 - 35 (U/L)    Alkaline Phosphatase 8 (*) 39 - 117 (U/L)    Total Bilirubin 0.2 (*) 0.3 - 1.2 (mg/dL)    GFR  calc non Af Amer >90  >90 (mL/min)    GFR calc Af Amer >90  >90 (mL/min)   PROTIME-INR     Status: Normal   Collection Time   01/01/11 11:22 PM      Component Value Range Comment   Prothrombin Time 14.2  11.6 - 15.2 (seconds)    INR 1.08  0.00 - 1.49    TYPE AND SCREEN     Status: Normal   Collection Time   01/01/11 11:31 PM      Component Value Range  Comment   ABO/RH(D) B POS      Antibody Screen NEG      Sample Expiration 01/04/2011     LACTIC ACID, PLASMA     Status: Normal   Collection Time   01/01/11 11:32 PM      Component Value Range Comment   Lactic Acid, Venous 0.6  0.5 - 2.2 (mmol/L)   ABO/RH     Status: Normal (Preliminary result)   Collection Time   01/01/11 11:32 PM      Component Value Range Comment   ABO/RH(D) B POS     COMPREHENSIVE METABOLIC PANEL     Status: Abnormal   Collection Time   01/02/11 12:40 AM      Component Value Range Comment   Sodium 140  135 - 145 (mEq/L)    Potassium 3.5  3.5 - 5.1 (mEq/L)    Chloride 111  96 - 112 (mEq/L)    CO2 23  19 - 32 (mEq/L)    Glucose, Bld 92  70 - 99 (mg/dL)    BUN 43 (*) 6 - 23 (mg/dL)    Creatinine, Ser 1.61  0.50 - 1.10 (mg/dL)    Calcium 7.5 (*) 8.4 - 10.5 (mg/dL)    Total Protein 5.4 (*) 6.0 - 8.3 (g/dL)    Albumin 3.0 (*) 3.5 - 5.2 (g/dL)    AST 10  0 - 37 (U/L)    ALT 8  0 - 35 (U/L)    Alkaline Phosphatase 37 (*) 39 - 117 (U/L)    Total Bilirubin 0.3  0.3 - 1.2 (mg/dL)    GFR calc non Af Amer >90  >90 (mL/min)    GFR calc Af Amer >90  >90 (mL/min)     Radiological Exams on Admission: No results found.  Assessment/Plan  Principal Problem:  *Blood loss anemia - This appears to be most likely secondary to lower GI bleed and please note the patient has history of gastric ulcers. She reports having endoscopy done several years ago but no colonoscopy. This can be secondary to hemorrhoids versus diverticulosis but given her hematemesis this can be also secondary to gastric ulcer bleeding. Review of  records indicate last hemoglobin of 13.6, 3 years ago and hemoglobin today on admission is 10.  - We will continue to monitor CBC - Obtain an anemia panel - If hemoglobin continues to trend down we'll obtain GI consult for further evaluation and recommendations - Continue IV fluids at the current rate - Continue protonic 40 mg by mouth daily and avoid NSAIDs - Provide supportive care with anti-emetics and analgesia as needed  Active Problems:  Leukocytosis - This is most likely secondary to principal problem, stress and demargination - Will obtain CBC in the morning - Patient has no symptoms suggestive of infectious etiology to explain leukocytosis, afebrile on admission, no fevers at home, no tachycardia   Disposition - Patient follows with Dr. Garner Gavel - Patient is full code - Diagnoses and treatment plan and care has been discussed with patient, no family available at bedside, patient has verbalized understanding and has no questions at this time  Time Spent on Admission: Over 30 minutes  MAGICK-Loretta Doutt 01/02/2011, 3:34 AM

## 2011-01-02 NOTE — Interval H&P Note (Signed)
History and Physical Interval Note:   01/02/2011   3:44 PM   Shawna Mcbride  has presented today for endoscopy, with the diagnosis of GI bleed--hematemesis  The various methods of treatment have been discussed with the patient and family. After consideration of risks, benefits and other options for treatment, the patient has consented to  Procedure(s): ESOPHAGOGASTRODUODENOSCOPY (EGD) as a surgical intervention .  The patients' history has been reviewed, patient examined, no change in status, stable for surgery.  I have reviewed the patients' chart and labs.  Questions were answered to the patient's satisfaction.     Florencia Reasons  MD

## 2011-01-02 NOTE — ED Provider Notes (Signed)
History     CSN: [id number] Arrival date & time: 01/01/2011  9:54 PM   First MD Initiated Contact with Patient 01/01/11 2229      Chief Complaint  Patient presents with  . GI Problem    (Consider location/radiation/quality/duration/timing/severity/associated sxs/prior treatment) The history is provided by the patient, the spouse and a relative.   45 yo F with h/o gastric ulcers here with GI bleed. Notes mild fatigue for 2d.  Today, had abrupt generalized weakness then multiple episodes of diarrhea (no bright red but dark) and vomiting x 2 (no bright red blood but coffee grounds).  Had syncope x 2 after this but no injuries.  Problem severe, intermittent and worsening today.  No abdominal pain.  No longer on meds for stomach as ulcer was yrs ago.  No h/o cirrhosis of varices.  Past Medical History  Diagnosis Date  . Arthritis     Past Surgical History  Procedure Date  . Abdominal hysterectomy     History reviewed. No pertinent family history.  History  Substance Use Topics  . Smoking status: Not on file  . Smokeless tobacco: Not on file  . Alcohol Use: Yes     ocassionally    OB History    Grav Para Term Preterm Abortions TAB SAB Ect Mult Living                  Review of Systems  Constitutional: Negative for fever and chills.  HENT: Negative for facial swelling.   Eyes: Negative for visual disturbance.  Respiratory: Negative for cough, chest tightness, shortness of breath and wheezing.   Cardiovascular: Negative for chest pain.  Gastrointestinal: Positive for nausea, vomiting and blood in stool. Negative for abdominal pain, diarrhea and rectal pain.  Genitourinary: Negative for difficulty urinating.  Skin: Negative for rash.  Neurological: Positive for light-headedness. Negative for weakness and numbness.  Psychiatric/Behavioral: Negative for behavioral problems and confusion.  All other systems reviewed and are negative.    Allergies  Review of patient's  allergies indicates no known allergies.  Home Medications  No current outpatient prescriptions on file.  BP 90/64  Pulse 67  Temp(Src) 97.5 F (36.4 C) (Oral)  Resp 16  SpO2 100%  Physical Exam  Nursing note and vitals reviewed. Constitutional: She is oriented to person, place, and time. She appears well-developed and well-nourished. No distress.  HENT:  Head: Normocephalic.  Nose: Nose normal.  Eyes: EOM are normal.  Neck: Normal range of motion. Neck supple.  Cardiovascular: Normal rate, regular rhythm and intact distal pulses.   Pulmonary/Chest: Effort normal and breath sounds normal. No respiratory distress.  Abdominal: Soft. She exhibits no distension. There is no tenderness. There is no rebound.  Genitourinary:       Rectal non tender.  Dark brown non melanotic stool  Musculoskeletal: Normal range of motion. She exhibits no edema and no tenderness.  Neurological: She is alert and oriented to person, place, and time.       Normal strength  Skin: Skin is warm and dry. No rash noted. She is not diaphoretic. There is pallor.  Psychiatric: She has a normal mood and affect. Her behavior is normal. Thought content normal.    ED Course  Procedures (including critical care time)  Labs Reviewed  CBC - Abnormal; Notable for the following:    WBC 14.2 (*)    RBC 3.39 (*)    Hemoglobin 10.6 (*)    HCT 31.6 (*)    All other  components within normal limits  DIFFERENTIAL - Abnormal; Notable for the following:    Neutrophils Relative 82 (*)    Neutro Abs 11.6 (*)    All other components within normal limits  COMPREHENSIVE METABOLIC PANEL - Abnormal; Notable for the following:    BUN 48 (*)    Alkaline Phosphatase 8 (*)    Total Bilirubin 0.2 (*)    All other components within normal limits  COMPREHENSIVE METABOLIC PANEL - Abnormal; Notable for the following:    BUN 43 (*)    Calcium 7.5 (*)    Total Protein 5.4 (*)    Albumin 3.0 (*)    Alkaline Phosphatase 37 (*)    All  other components within normal limits  LACTIC ACID, PLASMA  TYPE AND SCREEN  PROTIME-INR  OCCULT BLOOD, POC DEVICE  ABO/RH  POCT OCCULT BLOOD STOOL, DEVICE   No results found.   1. GI bleed     Date: 01/01/2011  Rate: 59   Rhythm: sinus bradycardia  QRS Axis: right  Intervals: normal  ST/T Wave abnormalities: nonspecific T wave changes  Conduction Disutrbances:none  Narrative Interpretation:   Old EKG Reviewed: none available     MDM  Clinical pic c/w upper GI bleed, likely 2/2 ulcer or gastritis.  No RF or h/o esophageal varices.  2 lg bore IVs in place. Protonix bolus and infusion started.  HDS and sxs improving with IVF.  Hgb 10.  BUN elevated.  [facility] consulted and will admit for GI bleed.  HD and mental status stable in ED.        Milus Glazier 01/02/11 0146  Milus Glazier 01/02/11 0147

## 2011-01-02 NOTE — Progress Notes (Signed)
Referring Provider:  Dr. Susie Cassette Primary Care Physician:  None (unassigned) Primary Gastroenterologist:  None. Seen in the past by Dr. Loreta Ave and Dr. Bosie Clos  Reason for Consultation:  GI bleeding  HPI: Shawna Mcbride is a 45 y.o. female with a long-standing history of peptic ulcer disease, going back at least 13 years with endoscopy by Dr. Loreta Ave reportedly showing ulcer disease, although I do not have a copy of those reports.   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   More recently, in March of 2011, Dr. Bosie Clos did endoscopy which showed an 8 mm antral ulcer. The patient was subsequently lost to followup due to lack of insurance and financial problems. In the past, she had been on Mobic. Her CLOtest was negative in 2011.  With that background, the patient developed syncope yesterday evening, without any prodromal dyspeptic symptoms. She had felt hot and sweaty the night before. Of note, she does use a couple of Goody powders most days, and smokes, less than one half pack per day. She does not use antibiotic therapy because of the expense, and the absence of insurance. After waking up from her syncopal episode, the patient vomited coffee-ground or dark material, and then had a large, incontinent dark burgundy stool and presented to the emergency room where her hemoglobin was 10.6 and her BUN was 48. Since admission, she has not had any further output from either end.    Past Medical History  Diagnosis Date  . Arthritis   Hemoperitoneum of unclear cause in 2003, treated conservatively. Partial small bowel obstruction 2001, treated conservatively.   Past Surgical History  Procedure Date  . Abdominal hysterectomy   Laparotomy 2009,  intended cholecystectomy, but found to have inflammatory mass associated with large pyloric channel ulcer   Prior to Admission medications   Not on File    Current Facility-Administered Medications  Medication Dose Route Frequency Provider Last Rate Last Dose  . 0.9 %  sodium chloride infusion   Intravenous Continuous Iskra Magick-Myers 75 mL/hr at 01/02/11 0624    . acetaminophen (TYLENOL) tablet 650 mg  650 mg Oral Q6H PRN Iskra Magick-Myers       Or  . acetaminophen (TYLENOL) suppository 650 mg  650 mg Rectal Q6H PRN Iskra Magick-Myers      . HYDROcodone-acetaminophen (NORCO) 5-325 MG per tablet 1 tablet  1 tablet Oral Once Iskra Magick-Myers   1 tablet at 01/02/11 0312  . HYDROcodone-acetaminophen (NORCO) 5-325 MG per tablet 1-2 tablet  1-2 tablet Oral Q4H PRN Iskra Magick-Myers   2 tablet at 01/02/11 0944  . nicotine (NICODERM CQ - dosed in mg/24 hours) patch 21 mg  21 mg Transdermal Daily Nayana Abrol   21 mg at 01/02/11 1305  . ondansetron (ZOFRAN) injection 4 mg  4 mg Intravenous Once [employer]   4 mg at 01/01/11 2351  . ondansetron (ZOFRAN) tablet 4 mg  4 mg Oral Q6H PRN Iskra Magick-Myers       Or  . ondansetron (ZOFRAN) injection 4 mg  4 mg Intravenous Q6H PRN Iskra Magick-Myers      . pantoprazole (PROTONIX) 80 mg in sodium chloride 0.9 % 100 mL IVPB  80 mg Intravenous To Major Mike Schinlever   80 mg at 01/02/11 0030  .  pantoprazole (PROTONIX) 80 mg in sodium chloride 0.9 % 250 mL infusion  8 mg/hr Intravenous To Major Mike Schinlever 25 mL/hr at 01/02/11 0041 8 mg/hr at 01/02/11 0041  . pantoprazole (PROTONIX) EC tablet 40 mg  40 mg Oral Q1200 Iskra Magick-Myers      . polyethylene glycol (MIRALAX / GLYCOLAX) packet 17 g  17 g Oral Daily Iskra Magick-Myers      . senna-docusate (Senokot-S) tablet 1 tablet  1 tablet Oral Daily PRN Iskra Magick-Myers      . sodium chloride 0.9 % bolus 1,000 mL  1,000 mL Intravenous Once [employer]   1,000 mL at 01/02/11 0302  . sodium  chloride 0.9 % bolus 1,000 mL  1,000 mL Intravenous Once [employer]   1,000 mL at 01/01/11 2200  . DISCONTD: enoxaparin (LOVENOX) injection 40 mg  40 mg Subcutaneous Q24H Iskra Magick-Myers      . DISCONTD: pantoprazole (PROTONIX) 40 MG injection             Allergies as of 01/01/2011  . (No Known Allergies)    History reviewed. No pertinent family history.  History   Social History  . Marital Status: Married    Spouse Name: N/A    Number of Children: N/A  . Years of Education: N/A  Lives with her son at home. On good terms with her ex-husband who lives nearby. 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 No insurance. Has been dismissed from her previous primary care provider at [facility] Occupational History  . Not on file.   Social History Main Topics  . Smoking status:  Smokes less than one half pack per day    . Smokeless tobacco: Not on file  . Alcohol Use: Yes     ocassionally  . Drug Use: No  . Sexually Active: Yes   Other Topics Concern  . Not on file   Social History Narrative  . No narrative on file    Review of Systems: Positive for symptoms in history of present illness, including recent night sweats, syncope, hematemesis, and melena Gen:  Denies any fever, chills, rigors, anorexia, fatigue, weakness, malaise, involuntary weight loss CV:  Denies chest pain, angina, palpitations, syncope, orthopnea, PND, peripheral edema Resp: Denies dyspnea, cough, sputum, wheezing, coughing up blood. GI: Denies dysphagia, abdominal pain, nausea, rectal bleeding, constipation, diarrhea, and fecal incontinence.  She moves her bowels daily, although the stools tend to be firm in character.  GU : Denies urinary burning, blood in urine, urinary frequency, urinary hesitancy, nocturnal urination, and urinary incontinence. MS: Denies joint pain or swelling.  Denies muscle weakness, cramps, atrophy. Does have chronic back pain and numbness down her left leg which she  thinks is due to a pinched nerve. Psych: Denies depression, anxiety, suicidal ideation, hallucinations,   Heme:  Denies enlarged lymph nodes. Neuro:   chronic headaches, helped by [facility] powders. Left leg paresthesias. Endo:  Denies any problems with heat or cold intolerance, polydipsia/polyuria  Physical Exam: Vital signs in last 24 hours: Temp:  [97.3 F (36.3 C)-98.3 F (36.8 C)] 98.3 F (36.8 C) (11/27 1000) Pulse Rate:  [56-67] 59  (11/27 1000) Resp:  [16-20] 18  (11/27 1000) BP: (88-98)/(54-70) 98/59 mmHg (11/27 1000) SpO2:  [97 %-100 %] 97 % (11/27 1000) Weight:  [52.1 kg (114 lb 13.8 oz)] 114 lb 13.8 oz (52.1 kg) (11/27 0700) Last BM Date: 01/01/11 General:   Alert,  Well-developed, well-nourished, pleasant and cooperative in NAD Head:  Normocephalic and atraumatic. Eyes:  Sclera clear, no icterus.    Mouth:   No ulcerations or lesions.  Oropharynx pink & moist. Neck:   No masses or thyromegaly. Lungs:  Clear throughout to auscultation.   No wheezes, crackles, or rhonchi. No evident respiratory distress. Heart:   Regular rate and rhythm; no murmurs, clicks, rubs,  or gallops. Abdomen:  Soft, nontender, nontympanitic, and nondistended. No masses, hepatosplenomegaly or ventral hernias noted. Normal bowel sounds, without bruits, guarding, or rebound.   Rectal:   empty ampulla, but liquid dark burgundy, almost black residue present   Msk:   Symmetrical without gross deformities. Extremities:   Without clubbing, cyanosis, or edema. Neurologic:  Alert and coherent;  grossly normal neurologically. Skin:  Intact without significant lesions or rashes. warm and dry. Cervical Nodes:  No significant cervical adenopathy. Psych:   Alert and cooperative. Normal mood and affect.  Intake/Output from previous day:   Intake/Output this shift:    Lab Results:  Basename 01/02/11 1015 01/01/11 2322  WBC 6.36.4 14.2*  HGB 8.9*9.1* 10.6*  HCT 27.1*27.2* 31.6*  PLT 169173 197    BMET  Basename 01/02/11 0040 01/01/11 2322  NA 140 135  K 3.5 SPECIMEN CONTAMINATED, UNABLE TO PERFORM TEST(S).  CL 111 101  CO2 23 19  GLUCOSE 92 86  BUN 43* 48*  CREATININE 0.51 0.51  CALCIUM 7.5* SPECIMEN CONTAMINATED, UNABLE TO PERFORM TEST(S).   LFT  Basename 01/02/11 0040  PROT 5.4*  ALBUMIN 3.0*  AST 10  ALT 8  ALKPHOS 37*  BILITOT 0.3  BILIDIR --  IBILI --   PT/INR  Basename 01/02/11 1015 01/01/11 2322  LABPROT 13.6 14.2  INR 1.02 1.08    C-Diff  not performed  Studies/Results: No results found.  Impression:  #1 upper GI bleed, most likely due to aspirin gastropathy  #2 acute posthemorrhagic anemia #3 history of recurrent peptic ulcer disease, previously H. pylori negative #4 history of idiopathic hemoperitoneum 2003 #5 history of partial small bowel obstruction 2001  Plan:  I agree with supportive medical care but also feel that the patient should have endoscopic evaluation today, the purpose and risks of which were reviewed with the patient and she is agreeable. Further management will depend on the endoscopic findings. The patient is aware that her smoking in her Marlin Canary powder usage contribute to her tendency for recurrent ulcers. However, she was not aware that omeprazole is available generically over-the-counter at low cost so I would recommend that she remain on that medication indefinitely in the future, particularly if she is going to continue to use ulcerogenic medication such as Goody powders for her pain.    LOS: 1 day   Raelea Gosse V  01/02/2011, 1:09 PM

## 2011-01-02 NOTE — Progress Notes (Signed)
PC from Dr. Kirby Funk. Reported pt has been admitted to his care. Ms. Lainez is patient of his wife, Maurice Small, MD. Request hospitalist be notified to change to hospitalist coverage.

## 2011-01-02 NOTE — Progress Notes (Signed)
01/02/2011 Shawna Mcbride, Shawna Mcbride SPARKS Case Management Note [phone]      Attempted to speak with patient regarding a case management consult regarding financial assistance with obtaining medications. Patient is unavailable, in Endo. Checked with Synetta Fail in pharmacy who states patient is eligible for indigent fund at [facility]. CM will continue to follow.

## 2011-01-03 ENCOUNTER — Encounter (HOSPITAL_COMMUNITY): Payer: Self-pay | Admitting: Gastroenterology

## 2011-01-03 LAB — BASIC METABOLIC PANEL
BUN: 12 mg/dL (ref 6–23)
CO2: 24 mEq/L (ref 19–32)
Calcium: 8.2 mg/dL — ABNORMAL LOW (ref 8.4–10.5)
Creatinine, Ser: 0.64 mg/dL (ref 0.50–1.10)
Glucose, Bld: 78 mg/dL (ref 70–99)

## 2011-01-03 LAB — CBC
HCT: 23.9 % — ABNORMAL LOW (ref 36.0–46.0)
MCH: 31.4 pg (ref 26.0–34.0)
MCV: 93.7 fL (ref 78.0–100.0)
Platelets: 144 10*3/uL — ABNORMAL LOW (ref 150–400)
RDW: 13.7 % (ref 11.5–15.5)

## 2011-01-03 MED ORDER — POTASSIUM CHLORIDE IN NACL 40-0.9 MEQ/L-% IV SOLN
INTRAVENOUS | Status: DC
  Administered 2011-01-03 – 2011-01-04 (×2): via INTRAVENOUS
  Filled 2011-01-03 (×3): qty 1000

## 2011-01-03 MED ORDER — PROMETHAZINE HCL 25 MG/ML IJ SOLN
25.0000 mg | Freq: Four times a day (QID) | INTRAMUSCULAR | Status: DC | PRN
  Administered 2011-01-03: 25 mg via INTRAVENOUS
  Filled 2011-01-03: qty 1

## 2011-01-03 NOTE — Progress Notes (Signed)
Subjective: Patient says she tried clear liquids today but vomited twice. The first episode had mild amount of coffee-ground emesis but the second one had yellow-colored clear liquid. She has not had a BM in a couple of days.  Objective: Blood pressure 98/56, pulse 59, temperature 97.8 F (36.6 C), temperature source Oral, resp. rate 18, height 5\' 7"  (1.702 m), weight 52.4 kg (115 lb 8.3 oz), SpO2 98.00%.  Intake/Output Summary (Last 24 hours) at 01/03/11 1639 Last data filed at 01/03/11 1500  Gross per 24 hour  Intake 1113.75 ml  Output      4 ml  Net 1109.75 ml   General exam: Patient lying supine in bed in no obvious distress. Respiratory system: Clear. Cardiovascular system: S1 and S2 heard, regular. No JVD or murmurs. Gastrointestinal system: Abdomen is nondistended, soft and normal bowel sounds heard. Nontender. Central nervous system: Alert and oriented. No focal neurological deficits.  Lab Results: Basic Metabolic Panel:  Basename 01/03/11 0650 01/02/11 1015 01/02/11 0040  NA 142 -- 140  K 3.3* -- 3.5  CL 110 -- 111  CO2 24 -- 23  GLUCOSE 78 -- 92  BUN 12 -- 43*  CREATININE 0.64 -- 0.51  CALCIUM 8.2* -- 7.5*  MG -- 1.8 --  PHOS -- 2.9 --   Liver Function Tests:  Basename 01/02/11 0040 01/01/11 2322  AST 10 12  ALT 8 9  ALKPHOS 37* 8*  BILITOT 0.3 0.2*  PROT 5.4* 6.1  ALBUMIN 3.0* 3.5   No results found for this basename: LIPASE:2,AMYLASE:2 in the last 72 hours No results found for this basename: AMMONIA:2 in the last 72 hours CBC:  Basename 01/03/11 0650 01/02/11 1738 01/01/11 2322  WBC 4.2 4.9 --  NEUTROABS -- -- 11.6*  HGB 8.0* 7.9* --  HCT 23.9* 23.9* --  MCV 93.7 93.7 --  PLT 144* 148* --   Cardiac Enzymes: No results found for this basename: CKTOTAL:3,CKMB:3,CKMBINDEX:3,TROPONINI:3 in the last 72 hours BNP: No results found for this basename: POCBNP:3 in the last 72 hours D-Dimer: No results found for this basename: DDIMER:2 in the last 72  hours CBG:  Basename 01/02/11 0747 01/02/11 0539  GLUCAP 94 95   Hemoglobin A1C: No results found for this basename: HGBA1C in the last 72 hours Fasting Lipid Panel: No results found for this basename: CHOL,HDL,LDLCALC,TRIG,CHOLHDL,LDLDIRECT in the last 72 hours Thyroid Function Tests:  Basename 01/02/11 1015  TSH 2.216  T4TOTAL --  FREET4 --  T3FREE --  THYROIDAB --   Anemia Panel: No results found for this basename: VITAMINB12,FOLATE,FERRITIN,TIBC,IRON,RETICCTPCT in the last 72 hours Coagulation:  Basename 01/02/11 1015 01/01/11 2322  LABPROT 13.6 14.2  INR 1.02 1.08   Urine Drug Screen: Drugs of Abuse  No results found for this basename: labopia, cocainscrnur, labbenz, amphetmu, thcu, labbarb    Alcohol Level: No results found for this basename: ETH:2 in the last 72 hours Urinalysis:  Misc. Labs:   Micro Results: No results found for this or any previous visit (from the past 240 hour(s)).  Studies/Results: No results found.  Medications: Scheduled Meds:   . nicotine  21 mg Transdermal Daily  . pantoprazole  40 mg Oral Q1200  . polyethylene glycol  17 g Oral Daily  . sucralfate  1 g Oral TID AC & HS   Continuous Infusions:   . sodium chloride 75 mL/hr at 01/02/11 2109   PRN Meds:.acetaminophen, acetaminophen, HYDROcodone-acetaminophen, ondansetron (ZOFRAN) IV, ondansetron, senna-docusate  Assessment/Plan: 1. Upper GI bleed secondary to multiple gastric  ulcers. GI followup appreciated. Continue PPI and sucralfate in hospital. Advance diet as tolerated. If patient is able to tolerate liquid diet then possible discharge tomorrow an outpatient advancement of diet. 2. Acute post hemorrhagic anemia secondary to GI bleeding: Monitor daily CBCs. Hemoglobin currently stable. 3. Hypokalemia: Replete and follow. 4. Tobacco abuse: Continue nicotine patch. 5. Mild thrombocytopenia. Follow CBCs tomorrow.  Disposition: Discharge when able to tolerate some  diet.    Shawna Mcbride 01/03/2011, 4:39 PM

## 2011-01-03 NOTE — Progress Notes (Signed)
GASTROENTEROLOGY PROGRESS NOTE  Problem:   GI bleed secondary to multiple gastric ulcers. Posthemorrhagic anemia.  Subjective: Doing okay, but intolerant of food. Indicates she vomited a small amount of grits she ate this morning.  Objective: Blood pressure is low, but no tachycardia. Skin is warm and dry. She has significant pallor consistent with her posthemorrhagic anemia. Abdomen is without any mass effect or tenderness.  Hemoglobin has leveled off at 7.9, BUN has declined to normal, both consistent with the absence of further GI bleeding. This correlates with the fact that she has had no further bowel movements.  Assessment: Stable with respect to GI bleed. Some residual stomach upset and food intolerance  Plan: #1 okay for discharge from the GI bleeding standpoint,  whenever she can tolerate food reliably. I did recommend that she eat frequent small amounts, rather than full meals. She might need one more day in the hospital to confirm tolerance, or alternatively she could go home on liquids and advance her diet as an outpatient.  Would discharge on once daily omeprazole (see below) but I do not think she needs sucralfate as an outpatient.  #2 I have advised the patient to try to avoid the use of Goody powders for the next month or 2 until her ulcers have had a chance to heal  #3 I have advised the patient to become established at [facility] since she has no insurance and has been dismissed from her primary physicians practice at [facility]  #4 I have advised the patient, upon discharge, to purchase omeprazole 20 mg over-the-counter and to take 1 tablet daily indefinitely. Long-term safety, including risks of micronutrients malabsorption and possible osteoporosis, discussed with patient. She needs to take it for at least 2 months to heal or ulcers, but should remain on it thereafter if she remains on ulcerogenic medication such as Goody powders.  #5 I have  requested the patient to followup with her primary gastroenterologist, Dr. Doy Mince, in approximately one month.  I gave her our card for that purpose, to call and make that appointment.  At that time, he can update a blood count and arrange a followup endoscopy. The patient is aware that she has been dismissed is a patient from our practice , since we are part of [facility], but that she was reinstated for the purpose of this hospitalization and its followup. If she does not follow up with [place] in the office, she will be considered dismissed and thus unassigned for the purpose of future GI care.  #6  it might be appropriate to send the patient home on iron supplementation to help rebuild her blood count. However, if this is done, I would not have her start it for at least a week or 2 after discharge, to allow her stomach to settle down in terms of her dyspeptic symptoms and food intolerance, and to prevent confusion from the dark stools that the iron might cause.  #7 I will sign off at this time. Call if we can be of further assistance.  Florencia Reasons, M.D. 01/03/2011 11:48 AM

## 2011-01-04 LAB — GLUCOSE, CAPILLARY

## 2011-01-04 LAB — CBC
MCH: 31.6 pg (ref 26.0–34.0)
MCHC: 34 g/dL (ref 30.0–36.0)
RDW: 13.3 % (ref 11.5–15.5)

## 2011-01-04 LAB — BASIC METABOLIC PANEL
BUN: 9 mg/dL (ref 6–23)
Calcium: 8.3 mg/dL — ABNORMAL LOW (ref 8.4–10.5)
GFR calc Af Amer: >90 mL/min (ref >90)
GFR calc non Af Amer: >90 mL/min (ref >90)
Potassium: 3.6 mEq/L (ref 3.5–5.1)
Sodium: 140 mEq/L (ref 135–145)

## 2011-01-04 MED ORDER — OMEPRAZOLE 20 MG PO CPDR: 20.0000 mg | DELAYED_RELEASE_CAPSULE | Freq: Every day | ORAL | Status: DC

## 2011-01-04 MED ORDER — NICOTINE 21 MG/24HR TD PT24: 1.0000 | MEDICATED_PATCH | Freq: Every day | TRANSDERMAL | Status: AC

## 2011-01-04 NOTE — Discharge Summary (Signed)
Discharge Summary  Shawna Mcbride MR#: [id number] DOB:06/17/1965  Date of Admission: 01/01/2011 Date of Discharge: 01/04/2011  Patient's PCP: No primary provider on file.  Attending Physician:Lanya Bucks  Consults: 1. Gastroenterology: Florencia Reasons, MD   Discharge Diagnoses: 1. Upper GI bleed secondary to gastric ulcers. 2. Acute post hemorrhagic anemia 3. Tobacco abuse   Brief Admitting History and Physical 45 year old female with history of arthritis and presented to the emergency room with history of melena and an episode of hematemesis. She denied similar complaints in the past. She took Goody's powder for headaches. Evaluation in the emergency room showed patient to be hypotensive with blood pressure of 89/70, pulse 66, temperature 97.5, respiration 16 and saturating at 100% on room air. Abdomen was soft. Stool was positive for occult blood. Initial hemoglobin was 10.6. She was admitted for further evaluation and management.  Discharge Medications Current Discharge Medication List    START taking these medications   Details  nicotine (NICODERM CQ - DOSED IN MG/24 HOURS) 21 mg/24hr patch Place 1 patch (21 patches total) onto the skin daily. Qty: 21 patch, Refills: 0    omeprazole (PRILOSEC) 20 MG capsule Take 1 capsule (20 mg total) by mouth daily. Qty: 30 capsule, Refills: 0        Hospital Course: 1. Upper gastrointestinal bleeding secondary to gastric ulcers: Patient was admitted to the hospital. She was started on IV Protonix and GI was consulted. They performed EGD which showed 3 gastric ulcers. Patient has tolerated soft diet today. She has no further nausea vomiting. She did have an episode of vomiting with yellow liquid yesterday. Gastroenterology has cleared her for discharge and advance her diet gradually. They recommend once daily omeprazole. She's been advised to avoid Goody's powder and other NSAIDs. She is advised to call her primary gastroenterologist  for followup appointment to be seen in a month's time. 2. Acute blood loss anemia secondary to GI bleed. Hemoglobin is now stable. Recommend starting ferrous sulfate in a couple of weeks once her GI symptoms have resolved completely. 3. Tobacco abuse. Cessation counseling done. 4. hypokalemia: Repleted 5. Thrombocytopenia: Resolved  Day of Discharge BP 123/80  Pulse 91  Temp(Src) 98.9 F (37.2 C) (Oral)  Resp 18  Ht 5\' 7"  (1.702 m)  Wt 51 kg (112 lb 7 oz)  BMI 17.61 kg/m2  SpO2 98%  General exam: Patient is sitting up comfortably in bed having her meal. Respiratory system: Clear [place] system: S1 and S2 heard, regular Gastrointestinal system: Abdomen is nondistended, soft and nontender. Normal bowel sounds heard. Central nervous system: Alert and oriented. No focal neurological deficits.   Results for orders placed during the hospital encounter of 01/01/11 (from the past 48 hour(s))  CBC     Status: Abnormal   Collection Time   01/02/11  5:38 PM      Component Value Range Comment   WBC 4.9  4.0 - 10.5 (K/uL)    RBC 2.55 (*) 3.87 - 5.11 (MIL/uL)    Hemoglobin 7.9 (*) 12.0 - 15.0 (g/dL)    HCT 40.9 (*) 81.1 - 46.0 (%)    MCV 93.7  78.0 - 100.0 (fL)    MCH 31.0  26.0 - 34.0 (pg)    MCHC 33.1  30.0 - 36.0 (g/dL)    RDW 91.4  78.2 - 95.6 (%)    Platelets 148 (*) 150 - 400 (K/uL)   BASIC METABOLIC PANEL     Status: Abnormal   Collection Time   01/03/11  6:50 AM      Component Value Range Comment   Sodium 142  135 - 145 (mEq/L)    Potassium 3.3 (*) 3.5 - 5.1 (mEq/L)    Chloride 110  96 - 112 (mEq/L)    CO2 24  19 - 32 (mEq/L)    Glucose, Bld 78  70 - 99 (mg/dL)    BUN 12  6 - 23 (mg/dL) DELTA CHECK NOTED   Creatinine, Ser 0.64  0.50 - 1.10 (mg/dL)    Calcium 8.2 (*) 8.4 - 10.5 (mg/dL)    GFR calc non Af Amer >90  >90 (mL/min)    GFR calc Af Amer >90  >90 (mL/min)   CBC     Status: Abnormal   Collection Time   01/03/11  6:50 AM      Component Value Range Comment    WBC 4.2  4.0 - 10.5 (K/uL)    RBC 2.55 (*) 3.87 - 5.11 (MIL/uL)    Hemoglobin 8.0 (*) 12.0 - 15.0 (g/dL)    HCT 40.9 (*) 81.1 - 46.0 (%)    MCV 93.7  78.0 - 100.0 (fL)    MCH 31.4  26.0 - 34.0 (pg)    MCHC 33.5  30.0 - 36.0 (g/dL)    RDW 91.4  78.2 - 95.6 (%)    Platelets 144 (*) 150 - 400 (K/uL)   GLUCOSE, CAPILLARY     Status: Normal   Collection Time   01/03/11  7:27 AM      Component Value Range Comment   Glucose-Capillary 87  70 - 99 (mg/dL)    Comment 1 Notify RN      Comment 2 Documented in Chart     GLUCOSE, CAPILLARY     Status: Normal   Collection Time   01/03/11 11:55 AM      Component Value Range Comment   Glucose-Capillary 90  70 - 99 (mg/dL)   GLUCOSE, CAPILLARY     Status: Normal   Collection Time   01/03/11  9:39 PM      Component Value Range Comment   Glucose-Capillary 88  70 - 99 (mg/dL)   CBC     Status: Abnormal   Collection Time   01/04/11  5:05 AM      Component Value Range Comment   WBC 5.3  4.0 - 10.5 (K/uL)    RBC 3.10 (*) 3.87 - 5.11 (MIL/uL)    Hemoglobin 9.8 (*) 12.0 - 15.0 (g/dL)    HCT 21.3 (*) 08.6 - 46.0 (%)    MCV 92.9  78.0 - 100.0 (fL)    MCH 31.6  26.0 - 34.0 (pg)    MCHC 34.0  30.0 - 36.0 (g/dL)    RDW 57.8  46.9 - 62.9 (%)    Platelets 183  150 - 400 (K/uL)   BASIC METABOLIC PANEL     Status: Abnormal   Collection Time   01/04/11  5:05 AM      Component Value Range Comment   Sodium 140  135 - 145 (mEq/L)    Potassium 3.6  3.5 - 5.1 (mEq/L)    Chloride 105  96 - 112 (mEq/L)    CO2 27  19 - 32 (mEq/L)    Glucose, Bld 73  70 - 99 (mg/dL)    BUN 9  6 - 23 (mg/dL)    Creatinine, Ser 5.28  0.50 - 1.10 (mg/dL)    Calcium 8.3 (*) 8.4 - 10.5 (mg/dL)    GFR calc  non Af Amer >90  >90 (mL/min)    GFR calc Af Amer >90  >90 (mL/min)   GLUCOSE, CAPILLARY     Status: Normal   Collection Time   01/04/11  7:54 AM      Component Value Range Comment   Glucose-Capillary 81  70 - 99 (mg/dL)    Comment 1 Notify RN      Comment 2 Documented in  Chart       No results found.   Disposition: Patient is discharged home in stable condition  Diet: Advance diet gradually as tolerated  Activity: Increase activity gradually   Follow-up Appts: Discharge Orders    Future Orders Please Complete By Expires   Diet - low sodium heart healthy      Increase activity slowly      No wound care      Call MD for:  persistant nausea and vomiting      Call MD for:  severe uncontrolled pain      Call MD for:  persistant dizziness or light-headedness         TESTS THAT NEED FOLLOW-UP None  Time spent on discharge, talking to the patient, and coordinating care: 30 mins.   SignedMarcellus Scott, MD 01/04/2011, 4:16 PM

## 2011-01-04 NOTE — Progress Notes (Signed)
Spoke with patient about PCP, she is in need of a PCP and is interested in going to [facility].Made first available appt for f/u with Dr. Sherron Flemings 01/25/11 at 10:45am and eligibility appt 02/19/11 at 10:45am. Gave patient list of what to take to eligibility appt. Appts placed in Epic.Gave patient pharmacy discount card and application for protonix discount.

## 2013-10-21 ENCOUNTER — Other Ambulatory Visit: Payer: Self-pay | Admitting: Family Medicine

## 2013-10-21 DIAGNOSIS — Z1231 Encounter for screening mammogram for malignant neoplasm of breast: Secondary | ICD-10-CM

## 2013-10-30 ENCOUNTER — Ambulatory Visit: Payer: Self-pay

## 2013-11-04 ENCOUNTER — Ambulatory Visit
Admission: RE | Admit: 2013-11-04 | Discharge: 2013-11-04 | Disposition: A | Discharge status: Home / Self Care | Payer: BC Managed Care – PPO | Source: Ambulatory Visit | Attending: Family Medicine | Admitting: Family Medicine

## 2013-11-04 DIAGNOSIS — Z1231 Encounter for screening mammogram for malignant neoplasm of breast: Secondary | ICD-10-CM

## 2013-11-06 ENCOUNTER — Other Ambulatory Visit: Payer: Self-pay | Admitting: Family Medicine

## 2013-11-06 DIAGNOSIS — R928 Other abnormal and inconclusive findings on diagnostic imaging of breast: Secondary | ICD-10-CM

## 2013-11-18 ENCOUNTER — Ambulatory Visit
Admission: RE | Admit: 2013-11-18 | Discharge: 2013-11-18 | Disposition: A | Discharge status: Home / Self Care | Payer: BC Managed Care – PPO | Source: Ambulatory Visit | Attending: Family Medicine | Admitting: Family Medicine

## 2013-11-18 ENCOUNTER — Other Ambulatory Visit: Payer: Self-pay | Admitting: Family Medicine

## 2013-11-18 DIAGNOSIS — R928 Other abnormal and inconclusive findings on diagnostic imaging of breast: Secondary | ICD-10-CM

## 2015-03-14 ENCOUNTER — Encounter (HOSPITAL_COMMUNITY): Payer: Self-pay | Admitting: Cardiology

## 2015-03-14 ENCOUNTER — Emergency Department (HOSPITAL_COMMUNITY)
Admission: EM | Admit: 2015-03-14 | Discharge: 2015-03-14 | Disposition: A | Discharge status: Home / Self Care | Payer: Self-pay | Attending: Emergency Medicine | Admitting: Emergency Medicine

## 2015-03-14 DIAGNOSIS — H9203 Otalgia, bilateral: Secondary | ICD-10-CM | POA: Insufficient documentation

## 2015-03-14 DIAGNOSIS — G43909 Migraine, unspecified, not intractable, without status migrainosus: Secondary | ICD-10-CM | POA: Insufficient documentation

## 2015-03-14 DIAGNOSIS — Z79899 Other long term (current) drug therapy: Secondary | ICD-10-CM | POA: Insufficient documentation

## 2015-03-14 DIAGNOSIS — F17210 Nicotine dependence, cigarettes, uncomplicated: Secondary | ICD-10-CM | POA: Insufficient documentation

## 2015-03-14 DIAGNOSIS — Z3202 Encounter for pregnancy test, result negative: Secondary | ICD-10-CM | POA: Insufficient documentation

## 2015-03-14 DIAGNOSIS — Z8739 Personal history of other diseases of the musculoskeletal system and connective tissue: Secondary | ICD-10-CM | POA: Insufficient documentation

## 2015-03-14 DIAGNOSIS — Z8711 Personal history of peptic ulcer disease: Secondary | ICD-10-CM | POA: Insufficient documentation

## 2015-03-14 DIAGNOSIS — R1013 Epigastric pain: Secondary | ICD-10-CM | POA: Insufficient documentation

## 2015-03-14 LAB — URINE MICROSCOPIC-ADD ON

## 2015-03-14 LAB — COMPREHENSIVE METABOLIC PANEL
ALK PHOS: 76 U/L (ref 38–126)
ALT: 25 U/L (ref 14–54)
ANION GAP: 12 (ref 5–15)
AST: 37 U/L (ref 15–41)
Albumin: 3.8 g/dL (ref 3.5–5.0)
BILIRUBIN TOTAL: 0.5 mg/dL (ref 0.3–1.2)
BUN: 16 mg/dL (ref 6–20)
CALCIUM: 9.1 mg/dL (ref 8.9–10.3)
CO2: 23 mmol/L (ref 22–32)
Chloride: 106 mmol/L (ref 101–111)
Creatinine, Ser: 0.87 mg/dL (ref 0.44–1.00)
GFR calc non Af Amer: >60 mL/min (ref >60)
GLUCOSE: 118 mg/dL — AB (ref 65–99)
Potassium: 4 mmol/L (ref 3.5–5.1)
Sodium: 141 mmol/L (ref 135–145)
TOTAL PROTEIN: 7.4 g/dL (ref 6.5–8.1)

## 2015-03-14 LAB — CBC
HCT: 46.1 % — ABNORMAL HIGH (ref 36.0–46.0)
HEMOGLOBIN: 15.5 g/dL — AB (ref 12.0–15.0)
MCH: 31.6 pg (ref 26.0–34.0)
MCHC: 33.6 g/dL (ref 30.0–36.0)
MCV: 94.1 fL (ref 78.0–100.0)
Platelets: 220 10*3/uL (ref 150–400)
RBC: 4.9 MIL/uL (ref 3.87–5.11)
RDW: 14.6 % (ref 11.5–15.5)
WBC: 3.7 10*3/uL — ABNORMAL LOW (ref 4.0–10.5)

## 2015-03-14 LAB — URINALYSIS, ROUTINE W REFLEX MICROSCOPIC
BILIRUBIN URINE: NEGATIVE
Glucose, UA: NEGATIVE mg/dL
Hgb urine dipstick: NEGATIVE
KETONES UR: 15 mg/dL — AB
NITRITE: NEGATIVE
PROTEIN: NEGATIVE mg/dL
SPECIFIC GRAVITY, URINE: 1.021 (ref 1.005–1.030)
pH: 5 (ref 5.0–8.0)

## 2015-03-14 LAB — LIPASE, BLOOD: Lipase: 22 U/L (ref 11–51)

## 2015-03-14 LAB — POC URINE PREG, ED: PREG TEST UR: NEGATIVE

## 2015-03-14 MED ORDER — DIPHENHYDRAMINE HCL 50 MG/ML IJ SOLN
25.0000 mg | Freq: Once | INTRAMUSCULAR | Status: AC
  Administered 2015-03-14: 25 mg via INTRAVENOUS
  Filled 2015-03-14: qty 1

## 2015-03-14 MED ORDER — PROCHLORPERAZINE EDISYLATE 5 MG/ML IJ SOLN
10.0000 mg | Freq: Once | INTRAMUSCULAR | Status: AC
  Administered 2015-03-14: 10 mg via INTRAVENOUS
  Filled 2015-03-14: qty 2

## 2015-03-14 MED ORDER — ONDANSETRON HCL 4 MG/2ML IJ SOLN
4.0000 mg | Freq: Once | INTRAMUSCULAR | Status: AC | PRN
  Administered 2015-03-14: 4 mg via INTRAVENOUS
  Filled 2015-03-14: qty 2

## 2015-03-14 MED ORDER — MAGNESIUM SULFATE 2 GM/50ML IV SOLN
2.0000 g | Freq: Once | INTRAVENOUS | Status: AC
  Administered 2015-03-14: 2 g via INTRAVENOUS
  Filled 2015-03-14: qty 50

## 2015-03-14 MED ORDER — SODIUM CHLORIDE 0.9 % IV BOLUS (SEPSIS)
1000.0000 mL | Freq: Once | INTRAVENOUS | Status: AC
  Administered 2015-03-14: 1000 mL via INTRAVENOUS

## 2015-03-14 NOTE — ED Notes (Signed)
Patient verbalized understanding of discharge instructions and denies any further needs or questions at this time. VS stable. Patient ambulatory with steady gait.  

## 2015-03-14 NOTE — Discharge Instructions (Signed)
Migraine Headache A migraine headache is an intense, throbbing pain on one or both sides of your head. A migraine can last for 30 minutes to several hours. CAUSES  The exact cause of a migraine headache is not always known. However, a migraine may be caused when nerves in the brain become irritated and release chemicals that cause inflammation. This causes pain. Certain things may also trigger migraines, such as:  Alcohol.  Smoking.  Stress.  Menstruation.  Aged cheeses.  Foods or drinks that contain nitrates, glutamate, aspartame, or tyramine.  Lack of sleep.  Chocolate.  Caffeine.  Hunger.  Physical exertion.  Fatigue.  Medicines used to treat chest pain (nitroglycerine), birth control pills, estrogen, and some blood pressure medicines. SIGNS AND SYMPTOMS  Pain on one or both sides of your head.  Pulsating or throbbing pain.  Severe pain that prevents daily activities.  Pain that is aggravated by any physical activity.  Nausea, vomiting, or both.  Dizziness.  Pain with exposure to bright lights, loud noises, or activity.  General sensitivity to bright lights, loud noises, or smells. Before you get a migraine, you may get warning signs that a migraine is coming (aura). An aura may include:  Seeing flashing lights.  Seeing bright spots, halos, or zigzag lines.  Having tunnel vision or blurred vision.  Having feelings of numbness or tingling.  Having trouble talking.  Having muscle weakness. DIAGNOSIS  A migraine headache is often diagnosed based on:  Symptoms.  Physical exam.  A CT scan or MRI of your head. These imaging tests cannot diagnose migraines, but they can help rule out other causes of headaches. TREATMENT Medicines may be given for pain and nausea. Medicines can also be given to help prevent recurrent migraines.  HOME CARE INSTRUCTIONS  Only take over-the-counter or prescription medicines for pain or discomfort as directed by your  health care provider. The use of long-term narcotics is not recommended.  Lie down in a dark, quiet room when you have a migraine.  Keep a journal to find out what may trigger your migraine headaches. For example, write down:  What you eat and drink.  How much sleep you get.  Any change to your diet or medicines.  Limit alcohol consumption.  Quit smoking if you smoke.  Get 7-9 hours of sleep, or as recommended by your health care provider.  Limit stress.  Keep lights dim if bright lights bother you and make your migraines worse. SEEK IMMEDIATE MEDICAL CARE IF:   Your migraine becomes severe.  You have a fever.  You have a stiff neck.  You have vision loss.  You have muscular weakness or loss of muscle control.  You start losing your balance or have trouble walking.  You feel faint or pass out.  You have severe symptoms that are different from your first symptoms. MAKE SURE YOU:   Understand these instructions.  Will watch your condition.  Will get help right away if you are not doing well or get worse.   This information is not intended to replace advice given to you by your health care provider. Make sure you discuss any questions you have with your health care provider.   Document Released: 01/22/2005 Document Revised: 02/12/2014 Document Reviewed: 09/29/2012 Elsevier Interactive Patient Education 2016 [employer]  Earache An earache, also called otalgia, can be caused by many things. Pain from an earache can be sharp, dull, or burning. The pain may be temporary or constant. Earaches can be caused by  problems with the ear, such as infection in either the middle ear or the ear canal, injury, impacted ear wax, middle ear pressure, or a foreign body in the ear. Ear pain can also result from problems in other areas. This is called referred pain. For example, pain can come from a sore throat, a tooth infection, or problems with the jaw or the joint between the jaw  and the skull (temporomandibular joint, or TMJ). The cause of an earache is not always easy to identify. Watchful waiting may be appropriate for some earaches until a clear cause of the pain can be found. HOME CARE INSTRUCTIONS Watch your condition for any changes. The following actions may help to lessen any discomfort that you are feeling:  Take medicines only as directed by your health care provider. This includes ear drops.  Apply ice to your outer ear to help reduce pain.  Put ice in a plastic bag.  Place a towel between your skin and the bag.  Leave the ice on for 20 minutes, 2-3 times per day.  Do not put anything in your ear other than medicine that is prescribed by your health care provider.  Try resting in an upright position instead of lying down. This may help to reduce pressure in the middle ear and relieve pain.  Chew gum if it helps to relieve your ear pain.  Control any allergies that you have.  Keep all follow-up visits as directed by your health care provider. This is important. SEEK MEDICAL CARE IF:  Your pain does not improve within 2 days.  You have a fever.  You have new or worsening symptoms. SEEK IMMEDIATE MEDICAL CARE IF:  You have a severe headache.  You have a stiff neck.  You have difficulty swallowing.  You have redness or swelling behind your ear.  You have drainage from your ear.  You have hearing loss.  You feel dizzy.   This information is not intended to replace advice given to you by your health care provider. Make sure you discuss any questions you have with your health care provider.   Document Released: 09/09/2003 Document Revised: 02/12/2014 Document Reviewed: 08/23/2013 Elsevier Interactive Patient Education [employer]

## 2015-03-14 NOTE — ED Provider Notes (Signed)
CSN: [id number]     Arrival date & time 03/14/15  1302 History   First MD Initiated Contact with Patient 03/14/15 2030     Chief Complaint  Patient presents with  . Otalgia  . Abdominal Pain     (Consider location/radiation/quality/duration/timing/severity/associated sxs/prior Treatment) HPI Comments: 50yo F w/ PMH including PUD and migraines who p/w otalgia and headache. Patient reports 2 weeks of bilateral ear pain, left greater than right, associated with a pressure feeling and decreased hearing in her left ear. She reports associated nasal congestion and mild scratchy throat. She talked to a pharmacist who recommended Sudafed and she took this for 9 or 10 days. It mildly helped but she reports that her ear pain has returned. She denies any fevers. Patient also reports a migraine beginning this morning that is typical of her usual migraines. She has associated photophobia. She states that when she gets a migraine, she does sometimes have vomiting with it and had an episode of vomiting "acid" this morning. She states that after that she began having upper abdominal pain that is the same abdominal pain that she has had for many years. She denies any unusual abdominal pain or blood in her stool. No urinary symptoms.  Patient is a 50 y.o. female presenting with ear pain and abdominal pain. The history is provided by the patient.  Otalgia Associated symptoms: abdominal pain   Abdominal Pain   Past Medical History  Diagnosis Date  . Arthritis    Past Surgical History  Procedure Laterality Date  . Abdominal hysterectomy    . Esophagogastroduodenoscopy  01/02/2011    Procedure: ESOPHAGOGASTRODUODENOSCOPY (EGD);  Surgeon: Florencia Reasons, MD;  Location: [facility] ENDOSCOPY;  Service: Endoscopy;  Laterality: N/A;  ok to do in endo unit, regular adult endoscope   Family History  Problem Relation Age of Onset  . Anesthesia problems Neg Hx   . Hypotension Neg Hx   . Malignant hyperthermia Neg Hx   .  Pseudochol deficiency Neg Hx    Social History  Substance Use Topics  . Smoking status: Current Every Day Smoker -- 0.50 packs/day    Types: Cigarettes  . Smokeless tobacco: None  . Alcohol Use: Yes     Comment: ocassionally   OB History    No data available     Review of Systems  HENT: Positive for ear pain.   Gastrointestinal: Positive for abdominal pain.    10 Systems reviewed and are negative for acute change except as noted in the HPI.   Allergies  Nsaids and Sulfa antibiotics  Home Medications   Prior to Admission medications   Medication Sig Start Date End Date Taking? Authorizing Provider  Aspirin-Acetaminophen-Caffeine (GOODY HEADACHE PO) Take 1 Package by mouth daily as needed (for pain).   Yes Historical Provider, MD  lansoprazole (PREVACID) 30 MG capsule Take 30 mg by mouth 2 (two) times daily before a meal.   Yes Historical Provider, MD  Multiple Vitamin (MULTIVITAMIN WITH MINERALS) TABS tablet Take 1 tablet by mouth daily.   Yes Historical Provider, MD  omeprazole (PRILOSEC) 20 MG capsule Take 1 capsule (20 mg total) by mouth daily. 01/04/11 01/04/12  Elease Etienne, MD   BP 119/70 mmHg  Pulse 71  Temp(Src) 98.5 F (36.9 C) (Oral)  Resp 18  SpO2 100% Physical Exam  Constitutional: She is oriented to person, place, and time. She appears well-developed and well-nourished. No distress.  uncomfortable, eyes closed  HENT:  Head: Normocephalic and atraumatic.  Moist  mucous membranes; effusion behind b/l TMs without erythema, normal light reflex  Eyes: Conjunctivae and EOM are normal. Pupils are equal, round, and reactive to light.  Neck: Neck supple.  Cardiovascular: Normal rate, regular rhythm and normal heart sounds.   No murmur heard. Pulmonary/Chest: Effort normal and breath sounds normal.  Abdominal: Soft. Bowel sounds are normal. She exhibits no distension.  Mild midepigastric tenderness without rebound or guarding  Musculoskeletal: She exhibits no  edema.  Neurological: She is alert and oriented to person, place, and time. No cranial nerve deficit. She exhibits normal muscle tone.  Fluent speech  Skin: Skin is warm and dry.  Psychiatric: She has a normal mood and affect. Judgment normal.  Nursing note and vitals reviewed.   ED Course  Procedures (including critical care time) Labs Review Labs Reviewed  COMPREHENSIVE METABOLIC PANEL - Abnormal; Notable for the following:    Glucose, Bld 118 (*)    All other components within normal limits  CBC - Abnormal; Notable for the following:    WBC 3.7 (*)    Hemoglobin 15.5 (*)    HCT 46.1 (*)    All other components within normal limits  LIPASE, BLOOD  URINALYSIS, ROUTINE W REFLEX MICROSCOPIC (NOT AT [facility])  POC URINE PREG, ED    Imaging Review No results found. I have personally reviewed and evaluated these lab results as part of my medical decision-making.   EKG Interpretation None     Medications  sodium chloride 0.9 % bolus 1,000 mL (not administered)  prochlorperazine (COMPAZINE) injection 10 mg (not administered)  diphenhydrAMINE (BENADRYL) injection 25 mg (not administered)  magnesium sulfate IVPB 2 g 50 mL (not administered)  ondansetron (ZOFRAN) injection 4 mg (4 mg Intravenous Given 03/14/15 2034)    MDM   Final diagnoses:  Migraine without status migrainosus, not intractable, unspecified migraine type  Otalgia of both ears    Pt presenting with 2 weeks of otalgia and nasal congestion as well as her typical migraine and abdominal pain that has been intermittently present for years. Exam she was uncomfortable but in no acute distress. Vital signs stable. No neurologic deficits on exam and patient states this migraine in similar to previous. Fluid behind b/l TMs but no evidence of acute infection. Gave the patient IV fluid bolus, Compazine, Benadryl, and magnesium for her headache.  Regarding ear pain, she has fluid behind her years but no evidence of infection. I  have recommended supportive care including antihistamine, nasal saline washes, and use of afrin for 2-3 days only as needed. Regarding her headache, and no new neurologic deficits or new complaints to suggest acute intracranial process. Regarding abdominal pain, she states this is her chronic abdominal pain and she denies any new symptoms associated with it. Her lab work here is unremarkable and shows hemoglobin 15.5 which is reassuring against GI bleeding. On reexamination, she is resting comfortably and states that her headache is much improved. I discussed supportive care instructions as well as return precautions. Patient voiced understanding and was discharged in satisfactory condition.  Laurence Spates, MD 03/14/15 [phone]

## 2015-03-14 NOTE — ED Notes (Signed)
Pt reports bilateral ear pain for the past 2 weeks. Also reports abd pain and vomiting. Hx of ulcers.

## 2016-07-16 ENCOUNTER — Ambulatory Visit (HOSPITAL_COMMUNITY)
Admission: EM | Admit: 2016-07-16 | Discharge: 2016-07-16 | Disposition: A | Discharge status: Home / Self Care | Payer: Self-pay | Attending: Internal Medicine | Admitting: Internal Medicine

## 2016-07-16 ENCOUNTER — Encounter (HOSPITAL_COMMUNITY): Payer: Self-pay | Admitting: Emergency Medicine

## 2016-07-16 DIAGNOSIS — J0100 Acute maxillary sinusitis, unspecified: Secondary | ICD-10-CM

## 2016-07-16 DIAGNOSIS — R059 Cough, unspecified: Secondary | ICD-10-CM

## 2016-07-16 DIAGNOSIS — J208 Acute bronchitis due to other specified organisms: Secondary | ICD-10-CM

## 2016-07-16 DIAGNOSIS — R05 Cough: Secondary | ICD-10-CM

## 2016-07-16 MED ORDER — METHYLPREDNISOLONE 4 MG PO TBPK: ORAL_TABLET | ORAL | 0 refills | Status: DC

## 2016-07-16 MED ORDER — AMOXICILLIN 875 MG PO TABS
875.0000 mg | ORAL_TABLET | Freq: Two times a day (BID) | ORAL | 0 refills | Status: DC
Start: 2016-07-16 — End: 2023-07-22

## 2016-07-16 MED ORDER — BENZONATATE 100 MG PO CAPS: 200.0000 mg | ORAL_CAPSULE | Freq: Three times a day (TID) | ORAL | 0 refills | Status: DC | PRN

## 2016-07-16 MED ORDER — ALBUTEROL SULFATE HFA 108 (90 BASE) MCG/ACT IN AERS: 1.0000 | INHALATION_SPRAY | Freq: Four times a day (QID) | RESPIRATORY_TRACT | 0 refills | Status: DC | PRN

## 2016-07-16 NOTE — ED Provider Notes (Signed)
CSN: [id number]     Arrival date & time 07/16/16  1110 History   None    Chief Complaint  Patient presents with  . Facial Pain   (Consider location/radiation/quality/duration/timing/severity/associated sxs/prior Treatment) Patient c/o sinus pain and pressure and cough symptoms for over a week.   The history is provided by the patient.  URI  Presenting symptoms: congestion, cough, facial pain, fatigue, fever, rhinorrhea and sore throat   Severity:  Moderate Onset quality:  Sudden Duration:  1 week Timing:  Constant Chronicity:  New Relieved by:  Nothing Worsened by:  Nothing Ineffective treatments:  None tried Associated symptoms: wheezing     Past Medical History:  Diagnosis Date  . Arthritis    Past Surgical History:  Procedure Laterality Date  . ABDOMINAL HYSTERECTOMY    . ESOPHAGOGASTRODUODENOSCOPY  01/02/2011   Procedure: ESOPHAGOGASTRODUODENOSCOPY (EGD);  Surgeon: Florencia Reasonsobert V Buccini, MD;  Location: [facility] ENDOSCOPY;  Service: Endoscopy;  Laterality: N/A;  ok to do in endo unit, regular adult endoscope   Family History  Problem Relation Age of Onset  . Anesthesia problems Neg Hx   . Hypotension Neg Hx   . Malignant hyperthermia Neg Hx   . Pseudochol deficiency Neg Hx    Social History  Substance Use Topics  . Smoking status: Current Every Day Smoker    Packs/day: 0.50    Types: Cigarettes  . Smokeless tobacco: Not on file  . Alcohol use Yes     Comment: ocassionally   OB History    No data available     Review of Systems  Constitutional: Positive for fatigue and fever.  HENT: Positive for congestion, rhinorrhea and sore throat.   Eyes: Negative.   Respiratory: Positive for cough and wheezing.   Cardiovascular: Negative.   Gastrointestinal: Negative.   Endocrine: Negative.   Genitourinary: Negative.   Musculoskeletal: Negative.   Allergic/Immunologic: Negative.   Neurological: Negative.   Hematological: Negative.   Psychiatric/Behavioral: Negative.      Allergies  Nsaids and Sulfa antibiotics  Home Medications   Prior to Admission medications   Medication Sig Start Date End Date Taking? Authorizing Provider  albuterol (PROVENTIL HFA;VENTOLIN HFA) 108 (90 Base) MCG/ACT inhaler Inhale 1-2 puffs into the lungs every 6 (six) hours as needed for wheezing or shortness of breath. 07/16/16   Deatra Canterxford, Frazier Balfour J, FNP  amoxicillin (AMOXIL) 875 MG tablet Take 1 tablet (875 mg total) by mouth 2 (two) times daily. 07/16/16   Deatra Canterxford, Kelsee Preslar J, FNP  benzonatate (TESSALON) 100 MG capsule Take 2 capsules (200 mg total) by mouth 3 (three) times daily as needed for cough. 07/16/16   Deatra Canterxford, Jonnae Fonseca J, FNP  methylPREDNISolone (MEDROL DOSEPAK) 4 MG TBPK tablet Take 6-5-4-3-2-1 po qd 07/16/16   Deatra Canterxford, Zlatan Hornback J, FNP   Meds Ordered and Administered this Visit  Medications - No data to display  BP 135/62 (BP Location: Right Arm)   Pulse 68   Temp 98 F (36.7 C) (Oral)   Resp 18   SpO2 99%  No data found.   Physical Exam  Constitutional: She is oriented to person, place, and time. She appears well-developed and well-nourished.  HENT:  Head: Normocephalic and atraumatic.  Eyes: Conjunctivae and EOM are normal. Pupils are equal, round, and reactive to light.  Neck: Normal range of motion. Neck supple.  Cardiovascular: Normal rate, regular rhythm and normal heart sounds.   Pulmonary/Chest: Effort normal. She has wheezes.  Abdominal: Soft. Bowel sounds are normal.  Neurological: She is alert and  oriented to person, place, and time.  Nursing note and vitals reviewed.   Urgent Care Course     Procedures (including critical care time)  Labs Review Labs Reviewed - No data to display  Imaging Review No results found.   Visual Acuity Review  Right Eye Distance:   Left Eye Distance:   Bilateral Distance:    Right Eye Near:   Left Eye Near:    Bilateral Near:         MDM   1. Acute bronchitis due to other specified organisms   2.  Cough   3. Subacute maxillary sinusitis    Amoxicillin 875mg  one po bid x 10 days #20 Medrol dose pack Tessalon perles Albuterol  Push po fluids, rest, tylenol and motrin otc prn as directed for fever, arthralgias, and myalgias.  Follow up prn if sx's continue or persist.    Deatra Canter, FNP 07/16/16 1305

## 2016-07-16 NOTE — ED Triage Notes (Signed)
[facility] a [facility] sinus pain and pressure, headache and nasal drainage x 1 week.

## 2020-02-25 ENCOUNTER — Other Ambulatory Visit: Payer: Self-pay

## 2020-02-25 ENCOUNTER — Emergency Department (INDEPENDENT_AMBULATORY_CARE_PROVIDER_SITE_OTHER)
Admission: EM | Admit: 2020-02-25 | Discharge: 2020-02-25 | Disposition: A | Discharge status: Home / Self Care | Payer: Self-pay | Source: Home / Self Care

## 2020-02-25 DIAGNOSIS — B86 Scabies: Secondary | ICD-10-CM

## 2020-02-25 DIAGNOSIS — T148XXA Other injury of unspecified body region, initial encounter: Secondary | ICD-10-CM

## 2020-02-25 MED ORDER — PERMETHRIN 5 % EX CREA: TOPICAL_CREAM | CUTANEOUS | 0 refills | Status: DC

## 2020-02-25 MED ORDER — MUPIROCIN 2 % EX OINT: TOPICAL_OINTMENT | CUTANEOUS | 0 refills | Status: DC

## 2020-02-25 MED ORDER — HYDROXYZINE HCL 25 MG PO TABS: 25.0000 mg | ORAL_TABLET | Freq: Four times a day (QID) | ORAL | 0 refills | Status: DC

## 2020-02-25 NOTE — ED Provider Notes (Signed)
[facility]    CSN: [id number] Arrival date & time: 02/25/20  1451      History   Chief Complaint Chief Complaint  Patient presents with  . Rash    scabies    HPI Shawna Mcbride is a 55 y.o. female.   HPI Shawna Mcbride is a 55 y.o. female presenting to UC with c/o 2-3 weeks of itchy red rash on arms, trunk and legs. She reports itching is worse at night, she has caused her skin to bleed and scab from scratching at night. 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 Pt's coworker at [employer] with similar symptoms.    Past Medical History:  Diagnosis Date  . Arthritis     Patient Active Problem List   Diagnosis Date Noted  . Blood loss anemia 01/02/2011  . Leukocytosis 01/02/2011    Past Surgical History:  Procedure Laterality Date  . ABDOMINAL HYSTERECTOMY    . ESOPHAGOGASTRODUODENOSCOPY  01/02/2011   Procedure: ESOPHAGOGASTRODUODENOSCOPY (EGD);  Surgeon: Florencia Reasons, MD;  Location: [facility] ENDOSCOPY;  Service: Endoscopy;  Laterality: N/A;  ok to do in endo unit, regular adult endoscope    OB History   No obstetric history on file.      Home Medications    Prior to Admission medications   Medication Sig Start Date End Date Taking? Authorizing Provider  hydrOXYzine (ATARAX/VISTARIL) 25 MG tablet Take 1 tablet (25 mg total) by mouth every 6 (six) hours. 02/25/20  Yes Lurene Shadow, PA-C  mupirocin ointment (BACTROBAN) 2 % Apply to wound 3 times daily for 5 days 02/25/20  Yes Doroteo Glassman, Vangie Bicker, PA-C  permethrin (ELIMITE) 5 % cream Apply head to toes, avoid face and genitals. Leave on 8-14 hours. Rinse thoroughly. 02/25/20  Yes Savana Spina O, PA-C  albuterol (PROVENTIL HFA;VENTOLIN HFA) 108 (90 Base) MCG/ACT inhaler Inhale 1-2 puffs into the lungs every 6 (six) hours as needed for wheezing or shortness of breath. 07/16/16   Deatra Canter, FNP  amoxicillin (AMOXIL) 875 MG tablet Take 1 tablet (875 mg total) by mouth 2 (two)  times daily. 07/16/16   Deatra Canter, FNP  benzonatate (TESSALON) 100 MG capsule Take 2 capsules (200 mg total) by mouth 3 (three) times daily as needed for cough. 07/16/16   Deatra Canter, FNP  methylPREDNISolone (MEDROL DOSEPAK) 4 MG TBPK tablet Take 6-5-4-3-2-1 po qd 07/16/16   Deatra Canter, FNP    Family History Family History  Problem Relation Age of Onset  . Anesthesia problems Neg Hx   . Hypotension Neg Hx   . Malignant hyperthermia Neg Hx   . Pseudochol deficiency Neg Hx     Social History Social History   Tobacco Use  . Smoking status: Current Every Day Smoker    Packs/day: 0.50    Types: Cigarettes  Substance Use Topics  . Alcohol use: Yes    Comment: ocassionally  . Drug use: Yes    Types: Marijuana     Allergies   Nsaids and Sulfa antibiotics   Review of Systems Review of Systems  Constitutional: Negative for chills and fever.  Gastrointestinal: Negative for diarrhea, nausea and vomiting.  Musculoskeletal: Negative for arthralgias, joint swelling and myalgias.  Skin: Positive for color change, rash and wound.     Physical Exam Triage Vital Signs ED Triage Vitals  Enc Vitals Group     BP 02/25/20 1532 (!) 144/99     Pulse  Rate 02/25/20 1532 85     Resp 02/25/20 1532 18     Temp 02/25/20 1532 97.9 F (36.6 C)     Temp Source 02/25/20 1532 Oral     SpO2 02/25/20 1532 99 %     Weight --      Height --      Head Circumference --      Peak Flow --      Pain Score 02/25/20 1535 1     Pain Loc --      Pain Edu? --      Excl. in GC? --    No data found.  Updated Vital Signs BP (!) 144/99 (BP Location: Right Arm)   Pulse 85   Temp 97.9 F (36.6 C) (Oral)   Resp 18   SpO2 99%   Visual Acuity Right Eye Distance:   Left Eye Distance:   Bilateral Distance:    Right Eye Near:   Left Eye Near:    Bilateral Near:     Physical Exam Vitals and nursing note reviewed.  Constitutional:      Appearance: Normal appearance. She is  well-developed and well-nourished.  HENT:     Head: Normocephalic and atraumatic.  Eyes:     Extraocular Movements: EOM normal.  Cardiovascular:     Rate and Rhythm: Normal rate.  Pulmonary:     Effort: Pulmonary effort is normal.  Musculoskeletal:        General: Normal range of motion.     Cervical back: Normal range of motion.  Skin:    General: Skin is warm and dry.     Findings: Erythema and rash present.     Comments: Diffuse erythematous papular rash, areas of excoriation, worst on right forearm.  Lesions on hands and web spacing of fingers.  Rash does blanch, non-tender  Neurological:     Mental Status: She is alert and oriented to person, place, and time.  Psychiatric:        Mood and Affect: Mood and affect normal.        Behavior: Behavior normal.      UC Treatments / Results  Labs (all labs ordered are listed, but only abnormal results are displayed) Labs Reviewed - No data to display  EKG   Radiology No results found.  Procedures Procedures (including critical care time)  Medications Ordered in UC Medications - No data to display  Initial Impression / Assessment and Plan / UC Course  I have reviewed the triage vital signs and the nursing notes.  Pertinent labs & imaging results that were available during my care of the patient were reviewed by me and considered in my medical decision making (see chart for details).     Hx and exam c/w scabies Will tx with permethrin and have pt use mupirocin for 5 days on ares of excoriation.  F/u with PCP or dermatology next week if needed  Final Clinical Impressions(s) / UC Diagnoses   Final diagnoses:  Scabies  Excoriation     Discharge Instructions      Atarax (hydroxizine) is an antihistamine that can be taken to help with itching. This medication can cause drowsiness so do not drive or drink alcohol while taking.    Call to schedule a follow up with primary care or dermatology next week if not  improving.    ED Prescriptions    Medication Sig Dispense Auth. Provider   permethrin (ELIMITE) 5 % cream Apply head to toes, avoid  face and genitals. Leave on 8-14 hours. Rinse thoroughly. 60 g Waylan Rocher O, PA-C   hydrOXYzine (ATARAX/VISTARIL) 25 MG tablet Take 1 tablet (25 mg total) by mouth every 6 (six) hours. 12 tablet Doroteo Glassman, Navayah Sok O, PA-C   mupirocin ointment (BACTROBAN) 2 % Apply to wound 3 times daily for 5 days 22 g Lurene Shadow, [place]     PDMP not reviewed this encounter.   Lurene Shadow, [place] 02/25/20 1807

## 2020-02-25 NOTE — ED Triage Notes (Signed)
Pt c/o rash x 2 weeks. 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 Some wounds are open due to scratching at night.

## 2020-02-25 NOTE — Discharge Instructions (Signed)
  Atarax (hydroxizine) is an antihistamine that can be taken to help with itching. This medication can cause drowsiness so do not drive or drink alcohol while taking.    Call to schedule a follow up with primary care or dermatology next week if not improving.

## 2020-09-06 ENCOUNTER — Emergency Department (HOSPITAL_COMMUNITY)
Admission: EM | Admit: 2020-09-06 | Discharge: 2020-09-06 | Disposition: A | Discharge status: Home / Self Care | Payer: Self-pay | Attending: Emergency Medicine | Admitting: Emergency Medicine

## 2020-09-06 ENCOUNTER — Encounter (HOSPITAL_COMMUNITY): Payer: Self-pay | Admitting: Emergency Medicine

## 2020-09-06 ENCOUNTER — Emergency Department (HOSPITAL_COMMUNITY): Payer: Self-pay

## 2020-09-06 ENCOUNTER — Other Ambulatory Visit: Payer: Self-pay

## 2020-09-06 DIAGNOSIS — M25562 Pain in left knee: Secondary | ICD-10-CM | POA: Insufficient documentation

## 2020-09-06 DIAGNOSIS — S2242XA Multiple fractures of ribs, left side, initial encounter for closed fracture: Secondary | ICD-10-CM | POA: Insufficient documentation

## 2020-09-06 DIAGNOSIS — F17210 Nicotine dependence, cigarettes, uncomplicated: Secondary | ICD-10-CM | POA: Insufficient documentation

## 2020-09-06 DIAGNOSIS — S82142A Displaced bicondylar fracture of left tibia, initial encounter for closed fracture: Secondary | ICD-10-CM | POA: Insufficient documentation

## 2020-09-06 DIAGNOSIS — S82832A Other fracture of upper and lower end of left fibula, initial encounter for closed fracture: Secondary | ICD-10-CM | POA: Insufficient documentation

## 2020-09-06 DIAGNOSIS — R000 Tachycardia, unspecified: Secondary | ICD-10-CM | POA: Insufficient documentation

## 2020-09-06 DIAGNOSIS — Y92410 Unspecified street and highway as the place of occurrence of the external cause: Secondary | ICD-10-CM | POA: Insufficient documentation

## 2020-09-06 MED ORDER — OXYCODONE-ACETAMINOPHEN 5-325 MG PO TABS: 1.0000 | ORAL_TABLET | Freq: Four times a day (QID) | ORAL | 0 refills | Status: DC | PRN

## 2020-09-06 MED ORDER — OXYCODONE-ACETAMINOPHEN 5-325 MG PO TABS
1.0000 | ORAL_TABLET | Freq: Once | ORAL | Status: AC
  Administered 2020-09-06: 1 via ORAL
  Filled 2020-09-06: qty 1
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
## 2020-09-06 NOTE — Progress Notes (Signed)
Orthopedic Tech Progress Note Patient Details:  Shawna Mcbride 04-19-1965 [id number]  Ortho Devices Type of Ortho Device: Crutches, Knee Immobilizer Ortho Device/Splint Location: LLE Ortho Device/Splint Interventions: Ordered, Application   Post Interventions Patient Tolerated: Well, Ambulated well Instructions Provided: Poper ambulation with device, Care of device  Donald Pore 09/06/2020, 6:10 PM

## 2020-09-06 NOTE — ED Notes (Signed)
Pt d/c'd via wheelchair with friend. Pt AxO x4. VSS. GCS 15. Pt said pain is much less.
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
## 2020-09-06 NOTE — Discharge Instructions (Signed)
You have multiple rib fractures and a fracture around your knee.  You need to wear the knee immobilizer at all times and do not put any weight on the knee.  Patient follow-up with the [facility] clinic but you will most likely also need to see orthopedics.  They may be able to help you with the cost.  Elevate and ice.

## 2020-09-06 NOTE — ED Notes (Signed)
Waiting for ortho tech to call me back for knee immobilizer and crutches. Pt aware of SW or CM should come by and speak with her. Pt given pain medication. Denies further needs.

## 2020-09-06 NOTE — ED Notes (Signed)
Ortho tech coming to place immobilizer and crutches.

## 2020-09-06 NOTE — Care Management (Signed)
ED RN Care Manager receive a consult to assist with follow up.   Discussed [employer] (Family Medicine) Patient instructed to contact the office tomorrow morning after 9a to schedule and ED follow up visit and to establish care.  Patient verbalized understanding ,  Updated the EDP as well.

## 2020-09-26 ENCOUNTER — Inpatient Hospital Stay: Payer: Self-pay | Admitting: Family Medicine
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
## 2023-07-22 ENCOUNTER — Other Ambulatory Visit: Payer: Self-pay

## 2023-07-22 ENCOUNTER — Observation Stay (HOSPITAL_COMMUNITY)
Admission: EM | Admit: 2023-07-22 | Discharge: 2023-07-23 | Disposition: A | Discharge status: Home / Self Care | Attending: Internal Medicine | Admitting: Internal Medicine

## 2023-07-22 ENCOUNTER — Encounter (HOSPITAL_COMMUNITY): Payer: Self-pay | Admitting: Internal Medicine

## 2023-07-22 DIAGNOSIS — K2980 Duodenitis without bleeding: Secondary | ICD-10-CM | POA: Diagnosis not present

## 2023-07-22 DIAGNOSIS — K269 Duodenal ulcer, unspecified as acute or chronic, without hemorrhage or perforation: Secondary | ICD-10-CM | POA: Insufficient documentation

## 2023-07-22 DIAGNOSIS — K921 Melena: Secondary | ICD-10-CM | POA: Diagnosis not present

## 2023-07-22 DIAGNOSIS — K922 Gastrointestinal hemorrhage, unspecified: Principal | ICD-10-CM | POA: Diagnosis present

## 2023-07-22 DIAGNOSIS — Z79899 Other long term (current) drug therapy: Secondary | ICD-10-CM | POA: Insufficient documentation

## 2023-07-22 DIAGNOSIS — F1010 Alcohol abuse, uncomplicated: Secondary | ICD-10-CM | POA: Diagnosis not present

## 2023-07-22 DIAGNOSIS — D62 Acute posthemorrhagic anemia: Secondary | ICD-10-CM | POA: Diagnosis not present

## 2023-07-22 DIAGNOSIS — K3189 Other diseases of stomach and duodenum: Secondary | ICD-10-CM | POA: Insufficient documentation

## 2023-07-22 DIAGNOSIS — F17210 Nicotine dependence, cigarettes, uncomplicated: Secondary | ICD-10-CM | POA: Diagnosis not present

## 2023-07-22 DIAGNOSIS — K209 Esophagitis, unspecified without bleeding: Secondary | ICD-10-CM | POA: Diagnosis not present

## 2023-07-22 DIAGNOSIS — Z720 Tobacco use: Secondary | ICD-10-CM | POA: Diagnosis present

## 2023-07-22 DIAGNOSIS — D649 Anemia, unspecified: Secondary | ICD-10-CM | POA: Diagnosis present

## 2023-07-22 DIAGNOSIS — K920 Hematemesis: Secondary | ICD-10-CM | POA: Diagnosis present

## 2023-07-22 LAB — CBC WITH DIFFERENTIAL/PLATELET
Abs Immature Granulocytes: 0.06 10*3/uL (ref 0.00–0.07)
Basophils Absolute: 0.1 10*3/uL (ref 0.0–0.1)
Basophils Relative: 1 %
Eosinophils Absolute: 0.2 10*3/uL (ref 0.0–0.5)
Eosinophils Relative: 2 %
HCT: 32.5 % — ABNORMAL LOW (ref 36.0–46.0)
Hemoglobin: 10.2 g/dL — ABNORMAL LOW (ref 12.0–15.0)
Immature Granulocytes: 1 %
Lymphocytes Relative: 15 %
Lymphs Abs: 1.4 10*3/uL (ref 0.7–4.0)
MCH: 28.7 pg (ref 26.0–34.0)
MCHC: 31.4 g/dL (ref 30.0–36.0)
MCV: 91.5 fL (ref 80.0–100.0)
Monocytes Absolute: 0.5 10*3/uL (ref 0.1–1.0)
Monocytes Relative: 5 %
Neutro Abs: 6.6 10*3/uL (ref 1.7–7.7)
Neutrophils Relative %: 76 %
Platelets: 254 10*3/uL (ref 150–400)
RBC: 3.55 MIL/uL — ABNORMAL LOW (ref 3.87–5.11)
RDW: 15.5 % (ref 11.5–15.5)
WBC: 8.8 10*3/uL (ref 4.0–10.5)
nRBC: 0 % (ref 0.0–0.2)

## 2023-07-22 LAB — COMPREHENSIVE METABOLIC PANEL WITH GFR
ALT: 13 U/L (ref 0–44)
ALT: 14 U/L (ref 0–44)
AST: 16 U/L (ref 15–41)
AST: 18 U/L (ref 15–41)
Albumin: 2.9 g/dL — ABNORMAL LOW (ref 3.5–5.0)
Albumin: 3.1 g/dL — ABNORMAL LOW (ref 3.5–5.0)
Alkaline Phosphatase: 61 U/L (ref 38–126)
Alkaline Phosphatase: 49 U/L (ref 38–126)
Anion gap: 8 (ref 5–15)
Anion gap: 10 (ref 5–15)
BUN: 38 mg/dL — ABNORMAL HIGH (ref 6–20)
BUN: 39 mg/dL — ABNORMAL HIGH (ref 6–20)
CO2: 22 mmol/L (ref 22–32)
CO2: 23 mmol/L (ref 22–32)
Calcium: 8.9 mg/dL (ref 8.9–10.3)
Calcium: 9 mg/dL (ref 8.9–10.3)
Chloride: 108 mmol/L (ref 98–111)
Chloride: 107 mmol/L (ref 98–111)
Creatinine, Ser: 0.75 mg/dL (ref 0.44–1.00)
Creatinine, Ser: 0.72 mg/dL (ref 0.44–1.00)
GFR, Estimated: >60 mL/min (ref >60)
GFR, Estimated: >60 mL/min (ref >60)
Glucose, Bld: 95 mg/dL (ref 70–99)
Glucose, Bld: 102 mg/dL — ABNORMAL HIGH (ref 70–99)
Potassium: 4.3 mmol/L (ref 3.5–5.1)
Potassium: 4.6 mmol/L (ref 3.5–5.1)
Sodium: 138 mmol/L (ref 135–145)
Sodium: 140 mmol/L (ref 135–145)
Total Bilirubin: 0.3 mg/dL (ref 0.0–1.2)
Total Bilirubin: 0.5 mg/dL (ref 0.0–1.2)
Total Protein: 5.9 g/dL — ABNORMAL LOW (ref 6.5–8.1)
Total Protein: 6.3 g/dL — ABNORMAL LOW (ref 6.5–8.1)

## 2023-07-22 LAB — ETHANOL: Alcohol, Ethyl (B): <15 mg/dL (ref <15)

## 2023-07-22 LAB — HEMOGLOBIN AND HEMATOCRIT, BLOOD
HCT: 27.2 % — ABNORMAL LOW (ref 36.0–46.0)
HCT: 29.2 % — ABNORMAL LOW (ref 36.0–46.0)
Hemoglobin: 8.4 g/dL — ABNORMAL LOW (ref 12.0–15.0)
Hemoglobin: 9.1 g/dL — ABNORMAL LOW (ref 12.0–15.0)

## 2023-07-22 LAB — MAGNESIUM: Magnesium: 2 mg/dL (ref 1.7–2.4)

## 2023-07-22 LAB — CBC
HCT: 32.9 % — ABNORMAL LOW (ref 36.0–46.0)
Hemoglobin: 10.2 g/dL — ABNORMAL LOW (ref 12.0–15.0)
MCH: 28.3 pg (ref 26.0–34.0)
MCHC: 31 g/dL (ref 30.0–36.0)
MCV: 91.4 fL (ref 80.0–100.0)
Platelets: 251 10*3/uL (ref 150–400)
RBC: 3.6 MIL/uL — ABNORMAL LOW (ref 3.87–5.11)
RDW: 15.5 % (ref 11.5–15.5)
WBC: 9.3 10*3/uL (ref 4.0–10.5)
nRBC: 0 % (ref 0.0–0.2)

## 2023-07-22 LAB — PROTIME-INR
INR: 1.2 (ref 0.8–1.2)
Prothrombin Time: 15.2 s (ref 11.4–15.2)

## 2023-07-22 LAB — IRON AND TIBC
Iron: 132 ug/dL (ref 28–170)
Saturation Ratios: 30 % (ref 10.4–31.8)
TIBC: 447 ug/dL (ref 250–450)
UIBC: 315 ug/dL

## 2023-07-22 LAB — FERRITIN: Ferritin: 12 ng/mL (ref 11–307)

## 2023-07-22 LAB — FOLATE: Folate: 22.9 ng/mL (ref >5.9)

## 2023-07-22 LAB — VITAMIN B12: Vitamin B-12: 1703 pg/mL — ABNORMAL HIGH (ref 180–914)

## 2023-07-22 LAB — TYPE AND SCREEN
ABO/RH(D): B POS
Antibody Screen: NEGATIVE

## 2023-07-22 MED ORDER — PANTOPRAZOLE SODIUM 40 MG IV SOLR: 40.0000 mg | Freq: Two times a day (BID) | INTRAVENOUS | Status: DC

## 2023-07-22 MED ORDER — ONDANSETRON HCL 4 MG/2ML IJ SOLN
4.0000 mg | Freq: Once | INTRAMUSCULAR | Status: AC
  Administered 2023-07-22: 4 mg via INTRAVENOUS
  Filled 2023-07-22: qty 2

## 2023-07-22 MED ORDER — PANTOPRAZOLE SODIUM 40 MG IV SOLR
40.0000 mg | INTRAVENOUS | Status: AC
  Administered 2023-07-22 (×2): 40 mg via INTRAVENOUS
  Filled 2023-07-22 (×2): qty 10

## 2023-07-22 MED ORDER — PANTOPRAZOLE SODIUM 40 MG IV SOLR
40.0000 mg | Freq: Four times a day (QID) | INTRAVENOUS | Status: DC
  Administered 2023-07-22 – 2023-07-23 (×3): 40 mg via INTRAVENOUS
  Filled 2023-07-22 (×4): qty 10

## 2023-07-22 MED ORDER — OCTREOTIDE LOAD VIA INFUSION
50.0000 ug | Freq: Once | INTRAVENOUS | Status: AC
  Administered 2023-07-22: 50 ug via INTRAVENOUS
  Filled 2023-07-22: qty 25

## 2023-07-22 MED ORDER — THIAMINE HCL 100 MG/ML IJ SOLN
100.0000 mg | Freq: Every day | INTRAMUSCULAR | Status: DC
  Administered 2023-07-22: 100 mg via INTRAVENOUS
  Filled 2023-07-22: qty 2

## 2023-07-22 MED ORDER — ACETAMINOPHEN 325 MG PO TABS
650.0000 mg | ORAL_TABLET | Freq: Four times a day (QID) | ORAL | Status: DC | PRN
  Administered 2023-07-22: 650 mg via ORAL
  Filled 2023-07-22: qty 2

## 2023-07-22 MED ORDER — THIAMINE MONONITRATE 100 MG PO TABS
100.0000 mg | ORAL_TABLET | Freq: Every day | ORAL | Status: DC
  Administered 2023-07-23: 100 mg via ORAL
  Filled 2023-07-22: qty 1

## 2023-07-22 MED ORDER — SODIUM CHLORIDE 0.9 % IV SOLN
50.0000 ug/h | INTRAVENOUS | Status: DC
  Administered 2023-07-22 – 2023-07-23 (×4): 50 ug/h via INTRAVENOUS
  Filled 2023-07-22 (×4): qty 1

## 2023-07-22 MED ORDER — LACTATED RINGERS IV SOLN: INTRAVENOUS | Status: AC

## 2023-07-22 MED ORDER — LORAZEPAM 2 MG/ML IJ SOLN: 1.0000 mg | INTRAMUSCULAR | Status: DC | PRN

## 2023-07-22 MED ORDER — ADULT MULTIVITAMIN W/MINERALS CH
1.0000 | ORAL_TABLET | Freq: Every day | ORAL | Status: DC
Start: 2023-07-22 — End: 2023-07-23
  Administered 2023-07-23: 1 via ORAL
  Filled 2023-07-22 (×2): qty 1

## 2023-07-22 MED ORDER — LORAZEPAM 1 MG PO TABS: 1.0000 mg | ORAL_TABLET | ORAL | Status: DC | PRN

## 2023-07-22 MED ORDER — ONDANSETRON HCL 4 MG/2ML IJ SOLN: 4.0000 mg | Freq: Four times a day (QID) | INTRAMUSCULAR | Status: DC | PRN

## 2023-07-22 MED ORDER — FOLIC ACID 1 MG PO TABS
1.0000 mg | ORAL_TABLET | Freq: Every day | ORAL | Status: DC
  Administered 2023-07-23: 1 mg via ORAL
  Filled 2023-07-22 (×2): qty 1

## 2023-07-22 MED ORDER — NICOTINE 14 MG/24HR TD PT24: 14.0000 mg | MEDICATED_PATCH | Freq: Every day | TRANSDERMAL | Status: DC | PRN

## 2023-07-22 MED ORDER — ACETAMINOPHEN 650 MG RE SUPP: 650.0000 mg | Freq: Four times a day (QID) | RECTAL | Status: DC | PRN
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
## 2023-07-22 NOTE — ED Notes (Signed)
 Pt ambulatory to restroom

## 2023-07-22 NOTE — TOC CM/SW Note (Signed)
 SW attached substance abuse resources to patient's AVS.   .Lily Peer, MSW, LCSWA Transition of Care  Clinical Social Worker (ED 3-11 Mon-Fri)  [phone]

## 2023-07-22 NOTE — H&P (Signed)
 History and Physical      Shawna Mcbride ZOX:[id number] DOB: 04-26-1965 DOA: 07/22/2023; DOS: 07/22/2023  PCP: Patient, No Pcp Per (will further assess) Patient coming from: home   I have personally briefly reviewed patient's old medical records in [facility]  Chief Complaint: Hematemesis  HPI: Shawna Mcbride is a 58 y.o. female with medical history significant for chronic alcohol abuse, who is admitted to [facility] on 07/22/2023 with acute upper GI bleed after presenting from home to [facility] ED complaining of hematemesis.   The patient reports 2 episode of vomiting while at home over the last day, associated with bright red blood in the vomitus.  Since arriving in the emergency department this evening, no additional episodes of hematemesis.  She has not noted any recent melena or hematochezia.  Denies any associated abdominal discomfort.  Also denies any associated SOB, CP, palpitations, diaphoresis, dizziness, presyncope, or syncope.   Per chart review, the patient may have had a gastrointestinal bleed in November 2012, with documentation of having undergone EGD at that time.  She reports daily consumption of at least 4 beers per day, but is not aware of any chronic underlying liver disease.  Additionally, she conveys that she typically uses 3 packs of Goody's powder per day.  Otherwise, does not use any additional aspirin or other blood thinners as an outpatient.  No additional NSAID use.  Per chart review, it appears that patient's most recent prior hemoglobin was found to be 15.5 and February 2017.    ED Course:  Vital signs in the ED were notable for the following: Afebrile; rates in the 50s to 80s; systolic blood pressures in the high 90s to 120s; respiratory rate 16-26, oxygen saturation 99 to 100% on room air.  Labs were notable for the following: CMP was notable for the following: BUN 38, creatinine 0.75, albumin 2.9.  Otherwise, liver enzymes were within normal limits.   CBC was notable for the following: Open cell count 9300, hemoglobin 10.2 associated Neuraceq/Norocarp properties as well as nonelevated RDW, platelet count 251.  INR 1.2.  Type and screen has been ordered.  EDP performed DRE revealing no evidence of stool in the rectal vault.  Per my interpretation, EKG in ED demonstrated the following: Sinus rhythm with heart rate 69, normal intervals, nonspecific T wave inversion in V2, nonspecific T wave flattening in aVL, no evidence of ST changes, including no evidence of ST elevation.  Imaging in the ED, per corresponding formal radiology read, was notable for the following: No additional imaging performed in the ED today.  EDP contacted on-call [facility], Dr. Nickey Barn, who will formally consult and see patient in the morning.   While in the ED, the following were administered: Octreotide 50 mcg IV x 1 dose followed by initiation of octreotide drip, Zofran  for milligrams IV x 1, Protonix  40 mg IV.  Subsequently, the patient was admitted for further evaluation management of presenting acute upper gastrointestinal bleed, presenting labs also notable for normocytic anemia of unclear chronicity.    Review of Systems: As per HPI otherwise 10 point review of systems negative.   Past Medical History:  Diagnosis Date   Arthritis     Past Surgical History:  Procedure Laterality Date   ABDOMINAL HYSTERECTOMY     ESOPHAGOGASTRODUODENOSCOPY  01/02/2011   Procedure: ESOPHAGOGASTRODUODENOSCOPY (EGD);  Surgeon: Brice Campi, MD;  Location: [facility] ENDOSCOPY;  Service: Endoscopy;  Laterality: N/A;  ok to do in endo unit, regular adult endoscope  Social History:  reports that she has been smoking cigarettes. She has never used smokeless tobacco. She reports current alcohol use. She reports current drug use. Drug: Marijuana.   Allergies  Allergen Reactions   Nsaids Other (See Comments)    Ulcers, takes goody or BC   Sulfa Antibiotics Hives    Family History   Problem Relation Age of Onset   Anesthesia problems Neg Hx    Hypotension Neg Hx    Malignant hyperthermia Neg Hx    Pseudochol deficiency Neg Hx     Family history reviewed and not pertinent    Prior to Admission medications   Medication Sig Start Date End Date Taking? Authorizing Provider  albuterol  (PROVENTIL  HFA;VENTOLIN  HFA) 108 (90 Base) MCG/ACT inhaler Inhale 1-2 puffs into the lungs every 6 (six) hours as needed for wheezing or shortness of breath. 07/16/16   Doc Freed, FNP  amoxicillin  (AMOXIL ) 875 MG tablet Take 1 tablet (875 mg total) by mouth 2 (two) times daily. 07/16/16   Doc Freed, FNP  benzonatate  (TESSALON ) 100 MG capsule Take 2 capsules (200 mg total) by mouth 3 (three) times daily as needed for cough. 07/16/16   Doc Freed, FNP  hydrOXYzine  (ATARAX /VISTARIL ) 25 MG tablet Take 1 tablet (25 mg total) by mouth every 6 (six) hours. 02/25/20   Daved Eriksson, PA-C  methylPREDNISolone  (MEDROL  DOSEPAK) 4 MG TBPK tablet Take 6-5-4-3-2-1 po qd 07/16/16   Doc Freed, FNP  mupirocin  ointment (BACTROBAN ) 2 % Apply to wound 3 times daily for 5 days 02/25/20   Daved Eriksson, PA-C  oxyCODONE -acetaminophen  (PERCOCET/ROXICET) 5-325 MG tablet Take 1 tablet by mouth every 6 (six) hours as needed for severe pain. 09/06/20   Almond Army, MD  permethrin  (ELIMITE ) 5 % cream Apply head to toes, avoid face and genitals. Leave on 8-14 hours. Rinse thoroughly. 02/25/20   Daved Eriksson, PA-C     Objective    Physical Exam: Vitals:   07/22/23 0130 07/22/23 0145 07/22/23 0200 07/22/23 0215  BP: 105/71 98/67 100/67 124/86  Pulse: 91 69 (!) 59 60  Resp: (!) 26 20 16 18   Temp:      SpO2: 100% 100% 100% 99%    General: appears to be stated age; alert, oriented Skin: warm, dry, no rash Head:  AT/[place] Mouth:  Oral mucosa membranes appear dry, normal dentition Neck: supple; trachea midline Heart:  RRR; did not appreciate any M/R/G Lungs: CTAB, did not appreciate  any wheezes, rales, or rhonchi Abdomen: + BS; soft, ND, NT Vascular: 2+ pedal pulses b/l; 2+ radial pulses b/l Extremities: no peripheral edema, no muscle wasting Neuro: strength and sensation intact in upper and lower extremities b/l    Labs on Admission: I have personally reviewed following labs and imaging studies  CBC: Recent Labs  Lab 07/22/23 0050  WBC 9.3  HGB 10.2*  HCT 32.9*  MCV 91.4  PLT 251   Basic Metabolic Panel: Recent Labs  Lab 07/22/23 0050  NA 138  K 4.3  CL 108  CO2 22  GLUCOSE 95  BUN 38*  CREATININE 0.75  CALCIUM 8.9   GFR: CrCl cannot be calculated (Unknown ideal weight.). Liver Function Tests: Recent Labs  Lab 07/22/23 0050  AST 16  ALT 13  ALKPHOS 61  BILITOT 0.3  PROT 5.9*  ALBUMIN 2.9*   No results for input(s): LIPASE, AMYLASE in the last 168 hours. No results for input(s): AMMONIA in the last 168 hours. Coagulation Profile: Recent  Labs  Lab 07/22/23 0200  INR 1.2   Cardiac Enzymes: No results for input(s): CKTOTAL, CKMB, CKMBINDEX, TROPONINI in the last 168 hours. BNP (last 3 results) No results for input(s): PROBNP in the last 8760 hours. HbA1C: No results for input(s): HGBA1C in the last 72 hours. CBG: No results for input(s): GLUCAP in the last 168 hours. Lipid Profile: No results for input(s): CHOL, HDL, LDLCALC, TRIG, CHOLHDL, LDLDIRECT in the last 72 hours. Thyroid Function Tests: No results for input(s): TSH, T4TOTAL, FREET4, T3FREE, THYROIDAB in the last 72 hours. Anemia Panel: No results for input(s): VITAMINB12, FOLATE, FERRITIN, TIBC, IRON, RETICCTPCT in the last 72 hours. Urine analysis:    Component Value Date/Time   COLORURINE YELLOW 03/14/2015 2048   APPEARANCEUR CLOUDY (A) 03/14/2015 2048   LABSPEC 1.021 03/14/2015 2048   PHURINE 5.0 03/14/2015 2048   GLUCOSEU NEGATIVE 03/14/2015 2048   HGBUR NEGATIVE 03/14/2015 2048   BILIRUBINUR NEGATIVE  03/14/2015 2048   KETONESUR 15 (A) 03/14/2015 2048   PROTEINUR NEGATIVE 03/14/2015 2048   UROBILINOGEN 0.2 09/08/2009 1959   NITRITE NEGATIVE 03/14/2015 2048   LEUKOCYTESUR TRACE (A) 03/14/2015 2048    Radiological Exams on Admission: No results found.    Assessment/Plan   Principal Problem:   Acute upper GI bleed Active Problems:   Normocytic anemia   Chronic alcohol abuse   Tobacco abuse    #) Acute Upper GI Bleed: diagnosis on the basis of patient's report of 2 episodes of hematemesis over the course the last day, with elevated BUN to 38. DRE by EDP revealed no stool in the rectal vault.  Presenting hemoglobin is 10.2 although the chronicity of this is unclear, given that most recent prior hemoglobin data point occurred in February 2017, was noted to be 15.5 at that time.   She is on blood thinners in the form of aspirin associated with her baseline 3 packs of Goody's powder per day.  She denies any known history of underlying liver disease, she conveys daily beer consumption.  Given predisposition towards variceal bleed, she was started on octreotide drip in the ED. clinically, acute variceal bleed appears less likely at this time but will continue octreotide drip for now.  In the absence of known history of liver disease, ascites, we will refrain from SBP prophylaxis at this time.  INR 1.2.  Given suspected upper GI source, will continue IV Protonix  that was initiated in the ED this evening.  Particular in the context of her chronic alcohol abuse, differential includes esophagitis, gastritis, peptic ulcer disease.  Less likely esophageal variceal bleed, as above.    At this time, the patient appears hemodynamically stable, with normotensive blood pressures in the absence of any associated tachycardia. Asymptomatic, last episode of hematemesis occurred while at home. Of note, patient was typed and screened in the ED today.  EDP contacted on-call [facility] gastroenterologist, Dr. Nickey Barn, who  will formally consult and see pt in the AM.     Plan: NPO. Refraining from pharmacologic DVT prophylaxis. Monitor on telemetry. Maintain at least 2 large bore IV's. Q4H H&H's have been ordered through 1300 on 6/16 . Will closely monitor these ensuing Hgb levels and correlate these data points with the patient's overall clinical picture including vital signs to determine need for ensuing transfusion. On-call GI consulted, as above, with additional recs pending at this time. Further evaluation and management of corresponding normocytic anemia, as further detailed below. CMP in the AM.  Continue Iv Protonix  as well as octreotide drip,  as above.  Again Ringer's at 75 cc/h x 12 hours.  Hold home Goody's powder.                     #) Normocytic anemia: Presenting hemoglobin of 10.2, although chronicity of this low hemoglobin finding is unclear given that most recent prior hemoglobin value was from February 2017 at which hgb was 15.5 the normocytic/normochromic nature of the patient's presenting hemoglobin may suggest an element of acuity to her presenting anemia, notable in the context of her presenting acute upper gastrointestinal bleed.  INR 1.2.   At this time, patient appears hemodynamically stable and asymptomatic, as further described above.  No indication for PRBC transfusion at this time, will closely monitor in setting hemoglobin trend while pursuing further evaluation and management of acute upper GI bleed, as further detailed below.   Plan: work-up and management for presenting suspected acute upper GI bleed, as above, including close monitoring of Q4H H&H's, with clinical evaluation for determination of need for blood transfusion, as further described above. Monitor on telemetry. NPO. Refraining from pharmacologic DVT prophylaxis.  Add on iron studies.  In the context of her daily alcohol consumption, will also check B12 and folate levels.  On-call of our gastroenterology to formally  consult in the morning, as above.  Hold him Goody's powder.                    #) Chronic Alcohol Abuse: the patient reports typical daily alcohol consumption of at least 4 beers per day. Most recent alcohol consumption appears to have occurred on 6/15. Close monitoring for development of evidence of alcohol withdrawal, including close attention to trend in vital signs, as well as close monitoring of electrolytes, as described below.   Plan: counseled the patient on the importance of reduction in alcohol consumption. Consult to transition of care team placed. Close monitoring of ensuing BP and HR via routine VS. Symptoms-based CIWA protocol with prn Ativan ordered. Seizure precautions. Telemetry. Add-on serum Mg level.  Repeat CMP in the morning. Add-on serum ethanol level.  Daily thiamine, folic acid, multi vitamin, with first dose of thiamine drip for now.                   #) Chronic tobacco abuse: Patient conveys that they are a current smoker, continuing to smoke half pack per day .   Plan: Counseled the patient for 3-5 minutes on the importance of complete smoking discontinuation.  Order placed for prn nicotine  patch for use during this hospitalization.     DVT prophylaxis: SCD's   Code Status: Full code Family Communication: none Disposition Plan: Per Rounding Team Consults called: EDP contacted on-call [facility], Dr. Nickey Barn, who will formally consult and see patient in the morning. ;  Admission status: Observation     I SPENT GREATER THAN 75  MINUTES IN CLINICAL CARE TIME/MEDICAL DECISION-MAKING IN COMPLETING THIS ADMISSION.      Gattis Kass Margurete Guaman DO [facility]  From 7PM - 7AM   07/22/2023, 2:32 AM

## 2023-07-22 NOTE — ED Notes (Signed)
 Pt ambulated to the bathroom.

## 2023-07-22 NOTE — ED Provider Notes (Signed)
 [place] EMERGENCY DEPARTMENT AT [facility] Provider Note   CSN: [id number] Arrival date & time: 07/22/23  0030     Patient presents with: Hematemesis   Zella LILLIEMAE FRUGE is a 58 y.o. female.   HPI Shantelle VERLON CARCIONE is a 58 y.o. female who presents to the Emergency Department complaining of hematemesis she presents to the emergency department for evaluation of symptoms are started around 9 PM.  She reports initially she had a small amount of burgundy emesis while she was in the shower.  She then vomited again later in the day but does not recall the second episode because she felt short of breath and lightheaded at the time.  She gets episodes of nausea and feeling hot but no pain.  She does have a history of gastric ulcers 10 years ago.  She does take 2-3 Goody powders daily.  Last dose was around 7 PM.  She takes that for arthritis.  She does not currently have a PCP but denies any additional medical problems.  No black or bloody stools.  She did have 1 loose stool today that was maybe slightly dark for her.  No shortness of breath or chest pain.  She does smoke 2 cigarettes daily as well as vapes.  She has 3-4 beers daily.  No history of alcohol withdrawal.  She does also use daily marijuana.     Prior to Admission medications   Medication Sig Start Date End Date Taking? Authorizing Provider  albuterol  (PROVENTIL  HFA;VENTOLIN  HFA) 108 (90 Base) MCG/ACT inhaler Inhale 1-2 puffs into the lungs every 6 (six) hours as needed for wheezing or shortness of breath. 07/16/16   Doc Freed, FNP  amoxicillin  (AMOXIL ) 875 MG tablet Take 1 tablet (875 mg total) by mouth 2 (two) times daily. 07/16/16   Doc Freed, FNP  benzonatate  (TESSALON ) 100 MG capsule Take 2 capsules (200 mg total) by mouth 3 (three) times daily as needed for cough. 07/16/16   Doc Freed, FNP  hydrOXYzine  (ATARAX /VISTARIL ) 25 MG tablet Take 1 tablet (25 mg total) by mouth every 6 (six) hours. 02/25/20   Daved Eriksson, PA-C  methylPREDNISolone  (MEDROL  DOSEPAK) 4 MG TBPK tablet Take 6-5-4-3-2-1 po qd 07/16/16   Doc Freed, FNP  mupirocin  ointment (BACTROBAN ) 2 % Apply to wound 3 times daily for 5 days 02/25/20   Daved Eriksson, PA-C  oxyCODONE -acetaminophen  (PERCOCET/ROXICET) 5-325 MG tablet Take 1 tablet by mouth every 6 (six) hours as needed for severe pain. 09/06/20   Almond Army, MD  permethrin  (ELIMITE ) 5 % cream Apply head to toes, avoid face and genitals. Leave on 8-14 hours. Rinse thoroughly. 02/25/20   Daved Eriksson, PA-C    Allergies: Nsaids and Sulfa antibiotics    Review of Systems  All other systems reviewed and are negative.   Updated Vital Signs BP 124/86   Pulse 60   Temp 97.6 F (36.4 C)   Resp 18   SpO2 99%   Physical Exam Vitals and nursing note reviewed.  Constitutional:      Appearance: She is well-developed.  HENT:     Head: Normocephalic and atraumatic.   Cardiovascular:     Rate and Rhythm: Normal rate and regular rhythm.     Heart sounds: No murmur heard. Pulmonary:     Effort: Pulmonary effort is normal. No respiratory distress.     Breath sounds: Normal breath sounds.  Abdominal:     Palpations: Abdomen is soft.  Tenderness: There is no abdominal tenderness. There is no guarding or rebound.  Genitourinary:    Comments: Empty rectal vault  Musculoskeletal:        General: No tenderness.   Skin:    General: Skin is warm and dry.   Neurological:     Mental Status: She is alert and oriented to person, place, and time.   Psychiatric:        Behavior: Behavior normal.     (all labs ordered are listed, but only abnormal results are displayed) Labs Reviewed  COMPREHENSIVE METABOLIC PANEL WITH GFR - Abnormal; Notable for the following components:      Result Value   BUN 38 (*)    Total Protein 5.9 (*)    Albumin 2.9 (*)    All other components within normal limits  CBC - Abnormal; Notable for the following components:   RBC 3.60 (*)     Hemoglobin 10.2 (*)    HCT 32.9 (*)    All other components within normal limits  PROTIME-INR  TYPE AND SCREEN    EKG: None  Radiology: No results found.   Procedures  CRITICAL CARE Performed by: Kelsey Patricia   Total critical care time: 35 minutes  Critical care time was exclusive of separately billable procedures and treating other patients.  Critical care was necessary to treat or prevent imminent or life-threatening deterioration.  Critical care was time spent personally by me on the following activities: development of treatment plan with patient and/or surrogate as well as nursing, discussions with consultants, evaluation of patient's response to treatment, examination of patient, obtaining history from patient or surrogate, ordering and performing treatments and interventions, ordering and review of laboratory studies, ordering and review of radiographic studies, pulse oximetry and re-evaluation of patient's condition.  Medications Ordered in the ED  octreotide (SANDOSTATIN) 2 mcg/mL load via infusion 50 mcg (50 mcg Intravenous Bolus from Bag 07/22/23 0202)    And  octreotide (SANDOSTATIN) 500 mcg in sodium chloride  0.9 % 250 mL (2 mcg/mL) infusion (50 mcg/hr Intravenous New Bag/Given 07/22/23 0202)  pantoprazole  (PROTONIX ) injection 40 mg (40 mg Intravenous Given 07/22/23 0158)    Followed by  pantoprazole  (PROTONIX ) injection 40 mg (has no administration in time range)    Followed by  pantoprazole  (PROTONIX ) injection 40 mg (has no administration in time range)  acetaminophen  (TYLENOL ) tablet 650 mg (has no administration in time range)    Or  acetaminophen  (TYLENOL ) suppository 650 mg (has no administration in time range)  ondansetron  (ZOFRAN ) injection 4 mg (4 mg Intravenous Given 07/22/23 0211)                                    Medical Decision Making Amount and/or Complexity of Data Reviewed Labs: ordered.  Risk Prescription drug management. Decision  regarding hospitalization.   Patient with history of peptic ulcer disease here for evaluation of hematemesis.  She has an empty rectal vault.  Hemoglobin is 10, no recent available for comparison.  BUN is elevated, likely secondary to upper GI bleed.  Given her history of alcohol use we will start on octreotide as well as Protonix .  She does not have any documented or known history of esophageal varices.  On exam there are no stigmata of advanced liver disease.  Discussed with patient findings of studies and recommendation for admission and she is in agreement with treatment plan.  Hospitalist consulted for  admission for ongoing care. D/w Dr. Nickey Barn with GI - he will see the patient in consult.      Final diagnoses:  Acute upper GI bleed    ED Discharge Orders     None          Kelsey Patricia, MD 07/22/23 [phone]

## 2023-07-22 NOTE — Progress Notes (Signed)
  Progress Note   Patient: Shawna Mcbride BJY:[id number] DOB: [birth year] DOA: 07/22/2023     0 DOS: the patient was seen and examined on 07/22/2023   Brief hospital course: 58 y.o. female with medical history significant for chronic alcohol abuse, who is admitted to [facility] on 07/22/2023 with acute upper GI bleed after presenting from home to [facility] ED complaining of hematemesis.    The patient reports 2 episode of vomiting while at home over the last day, associated with bright red blood in the vomitus.  Since arriving in the emergency department this evening, no additional episodes of hematemesis.  She has not noted any recent melena or hematochezia.  Denies any associated abdominal discomfort.   Also denies any associated SOB, CP, palpitations, diaphoresis, dizziness, presyncope, or syncope.   She reports daily consumption of at least 4 beers per day, but is not aware of any chronic underlying liver disease. Additionally, she conveys that she typically uses 3 packs of Goody's powder per day. Otherwise, does not use any additional aspirin or other blood thinners as an outpatient. No additional NSAID use.   Assessment and Plan: #) Acute blood loss anemia secondary to Upper GI Bleed:  -Previous EGD reviewed, notable for gastric ulcers -Pt admits to continued Goody Powder use -continue IV PPI -Octreotide was started on admit, however doubt variceal bleed -GI consulted. Recs for endoscopy tomorrow -follow CBC trends     #) Chronic Alcohol Abuse:  -no evidence of withdrawals at this time -Will need cessation -Continue CIWA as needed   #) Chronic tobacco abuse:  -reportedly continues to smoke half pack per day .  -cont nicotine  patch     Subjective: Pt asking about diet this AM while pt was still NPO. Denies abd pain  Physical Exam: Vitals:   07/22/23 1100 07/22/23 1210 07/22/23 1340 07/22/23 1443  BP: 102/67 98/70 100/67 114/61  Pulse: 60 61 (!) 51 (!) 55  Resp: 17 20 16 15    Temp:    99.2 F (37.3 C)  TempSrc:    Oral  SpO2: 100% 100% 100% 100%   General exam: Awake, laying in bed, in nad Respiratory system: Normal respiratory effort, no wheezing Cardiovascular system: regular rate, s1, s2 Gastrointestinal system: Soft, nondistended, positive BS Central nervous system: CN2-12 grossly intact, strength intact Extremities: Perfused, no clubbing Skin: Normal skin turgor, no notable skin lesions seen Psychiatry: Mood normal // no visual hallucinations   Data Reviewed:  Labs reviewed: Na 140, K 4.6, Cr 0.75, WBC 8.8, Hgb 10.2->8.4, Plts 254  Family Communication: Pt in room, family not at bedside  Disposition: Status is: Observation The patient remains OBS appropriate and will d/c before 2 midnights.  Planned Discharge Destination: Home    Author: Cherylle Corwin, MD 07/22/2023 3:43 PM  For on call review [email or web address].

## 2023-07-22 NOTE — ED Notes (Signed)
 CCMD called.

## 2023-07-22 NOTE — Hospital Course (Signed)
 58 y.o. female with medical history significant for chronic alcohol abuse, who is admitted to [facility] on 07/22/2023 with acute upper GI bleed after presenting from home to [facility] ED complaining of hematemesis.    The patient reports 2 episode of vomiting while at home over the last day, associated with bright red blood in the vomitus.  Since arriving in the emergency department this evening, no additional episodes of hematemesis.  She has not noted any recent melena or hematochezia.  Denies any associated abdominal discomfort.   Also denies any associated SOB, CP, palpitations, diaphoresis, dizziness, presyncope, or syncope.   She reports daily consumption of at least 4 beers per day, but is not aware of any chronic underlying liver disease. Additionally, she conveys that she typically uses 3 packs of Goody's powder per day. Otherwise, does not use any additional aspirin or other blood thinners as an outpatient. No additional NSAID use.

## 2023-07-22 NOTE — ED Triage Notes (Signed)
 Pt BIB EMS from home with c/o [facility], questionable syncopal episode. Pt was in the shower, had one episode of emesis with blood, upon ems arrival pt was lethargic, pale, no radial pulses. Coffee ground emesis per ems, hx of stomach ulcer, takes BC powders everyday. Per ems pt still pale but responsive after fluids.   103/60 -> 122/70 90s NSR  20 L wrist 20 RAC  LR 4mg  zofran 

## 2023-07-22 NOTE — ED Notes (Signed)
 Pt ambulated to the BR with standby assistance

## 2023-07-22 NOTE — Consult Note (Signed)
 [facility] Consultation Note  Referring Provider: [facility] Primary Care Physician:  Patient, No Pcp Per Primary Gastroenterologist:  Dr. Honey Lusty  Reason for Consultation:  hematemesis  HPI: Shawna Mcbride is a 58 y.o. female admitted couple episodes of hematemesis as well as some dark stools.  All over the past 24 hours.  Last episode hematemesis or bleeding around 12 hours ago.  Takes BC powders regularly.  No abdominal pain or change in bowel habits.  Similar episode in 2012, she had endoscopy showing some small gastric ulcers, and biopsies showed gastritis without H. Pylori.   Past Medical History:  Diagnosis Date   Arthritis     Past Surgical History:  Procedure Laterality Date   ABDOMINAL HYSTERECTOMY     ESOPHAGOGASTRODUODENOSCOPY  01/02/2011   Procedure: ESOPHAGOGASTRODUODENOSCOPY (EGD);  Surgeon: Brice Campi, MD;  Location: [facility] ENDOSCOPY;  Service: Endoscopy;  Laterality: N/A;  ok to do in endo unit, regular adult endoscope    Prior to Admission medications   Medication Sig Start Date End Date Taking? Authorizing Provider  Aspirin-Salicylamide-Caffeine (ARTHRITIS STRENGTH BC POWDER PO) Take 1 packet by mouth as needed (headache and pain).   Yes [provider]  BLACK COHOSH PO Take 2 tablets by mouth daily at 12 noon.   Yes [provider]  cetirizine (ZYRTEC) 10 MG tablet Take 10 mg by mouth daily at 12 noon.   Yes [provider]  Cyanocobalamin 500 MCG CHEW Chew 1 tablet by mouth daily at 12 noon.   Yes [provider]  lansoprazole (PREVACID) 15 MG capsule Take 15 mg by mouth daily at 12 noon.   Yes [provider]  Misc Natural Products (OSTEO BI-FLEX TRIPLE STRENGTH) TABS Take 0.5 tablets by mouth daily at 12 noon.   Yes [provider]  Multiple Vitamins-Minerals (MULTIVITAMIN WOMEN 50+) TABS Take 1 tablet by mouth daily.   Yes [provider]  [facility] Wort 300 MG CAPS Take 1  capsule by mouth daily at 12 noon.   Yes [provider]  vitamin E 180 MG (400 UNITS) capsule Take 400 Units by mouth daily at 12 noon.   Yes [provider]    Current Facility-Administered Medications  Medication Dose Route Frequency Provider Last Rate Last Admin   acetaminophen  (TYLENOL ) tablet 650 mg  650 mg Oral Q6H PRN Howerter, Justin B, DO       Or   acetaminophen  (TYLENOL ) suppository 650 mg  650 mg Rectal Q6H PRN Howerter, Justin B, DO       folic acid (FOLVITE) tablet 1 mg  1 mg Oral Daily Howerter, Justin B, DO       lactated ringers infusion   Intravenous Continuous Howerter, Justin B, DO 75 mL/hr at 07/22/23 1248 Infusion Verify at 07/22/23 1248   LORazepam (ATIVAN) tablet 1-4 mg  1-4 mg Oral Q1H PRN Howerter, Justin B, DO       Or   LORazepam (ATIVAN) injection 1-4 mg  1-4 mg Intravenous Q1H PRN Howerter, Justin B, DO       multivitamin with minerals tablet 1 tablet  1 tablet Oral Daily Howerter, Justin B, DO       nicotine  (NICODERM CQ  - dosed in mg/24 hours) patch 14 mg  14 mg Transdermal Daily PRN Howerter, Justin B, DO       octreotide (SANDOSTATIN) 500 mcg in sodium chloride  0.9 % 250 mL (2 mcg/mL) infusion  50 mcg/hr Intravenous Continuous Kelsey Patricia, MD 25 mL/hr at  07/22/23 1108 50 mcg/hr at 07/22/23 1108   ondansetron  (ZOFRAN ) injection 4 mg  4 mg Intravenous Q6H PRN Howerter, Justin B, DO       pantoprazole  (PROTONIX ) injection 40 mg  40 mg Intravenous Q6H Kelsey Patricia, MD   40 mg at 07/22/23 6761   Followed by   Cecily Cohen ON 07/25/2023] pantoprazole  (PROTONIX ) injection 40 mg  40 mg Intravenous Q12H Kelsey Patricia, MD       thiamine (VITAMIN B1) tablet 100 mg  100 mg Oral Daily Howerter, Justin B, DO       Or   thiamine (VITAMIN B1) injection 100 mg  100 mg Intravenous Daily Howerter, Justin B, DO   100 mg at 07/22/23 0321   Current Outpatient Medications  Medication Sig Dispense Refill   Aspirin-Salicylamide-Caffeine (ARTHRITIS STRENGTH BC  POWDER PO) Take 1 packet by mouth as needed (headache and pain).     BLACK COHOSH PO Take 2 tablets by mouth daily at 12 noon.     cetirizine (ZYRTEC) 10 MG tablet Take 10 mg by mouth daily at 12 noon.     Cyanocobalamin 500 MCG CHEW Chew 1 tablet by mouth daily at 12 noon.     lansoprazole (PREVACID) 15 MG capsule Take 15 mg by mouth daily at 12 noon.     Misc Natural Products (OSTEO BI-FLEX TRIPLE STRENGTH) TABS Take 0.5 tablets by mouth daily at 12 noon.     Multiple Vitamins-Minerals (MULTIVITAMIN WOMEN 50+) TABS Take 1 tablet by mouth daily.     [facility] Wort 300 MG CAPS Take 1 capsule by mouth daily at 12 noon.     vitamin E 180 MG (400 UNITS) capsule Take 400 Units by mouth daily at 12 noon.      Allergies as of 07/22/2023 - Review Complete 07/22/2023  Allergen Reaction Noted   Nsaids Other (See Comments) 03/14/2015   Sulfa antibiotics Hives 03/14/2015    Family History  Problem Relation Age of Onset   Anesthesia problems Neg Hx    Hypotension Neg Hx    Malignant hyperthermia Neg Hx    Pseudochol deficiency Neg Hx     Social History   Socioeconomic History   Marital status: Legally Separated    Spouse name: Not on file   Number of children: Not on file   Years of education: Not on file   Highest education level: Not on file  Occupational History   Not on file  Tobacco Use   Smoking status: Every Day    Current packs/day: 0.50    Types: Cigarettes   Smokeless tobacco: Never  Vaping Use   Vaping status: Some Days   Substances: Nicotine   Substance and Sexual Activity   Alcohol use: Yes    Comment: at least 4 beers per day   Drug use: Yes    Types: Marijuana   Sexual activity: Yes  Other Topics Concern   Not on file  Social History Narrative   Not on file   Social Drivers of Health   Financial Resource Strain: Not on file  Food Insecurity: Not on file  Transportation Needs: Not on file  Physical Activity: Not on file  Stress: Not on file  Social  Connections: Not on file  Intimate Partner Violence: Not on file    Review of Systems: As per HPI, all others negative  Physical Exam: Vital signs in last 24 hours: Temp:  [97.6 F (36.4 C)-98.3 F (36.8 C)] 98.3 F (36.8 C) (06/16 1000) Pulse  Rate:  [50-101] 61 (06/16 1210) Resp:  [12-26] 20 (06/16 1210) BP: (96-145)/(66-98) 98/70 (06/16 1210) SpO2:  [97 %-100 %] 100 % (06/16 1210)   General:   Alert,  Well-developed, well-nourished, pleasant and cooperative in NAD Head:  Normocephalic and atraumatic. Eyes:  Sclera clear, no icterus.   Conjunctiva pale Ears:  Normal auditory acuity. Nose:  No deformity, discharge,  or lesions. Mouth:   Poor dentition  Oropharynx pale and dry Neck:  Supple; no masses or thyromegaly. Lungs:  No visible respiratory distress Abdomen:  Soft, nontender and nondistended. No masses, hepatosplenomegaly or hernias noted. Normal bowel sounds, without guarding, and without rebound.     Msk:  Symmetrical without gross deformities. Normal posture. Pulses:  Normal pulses noted. Extremities:  Without clubbing or edema. Neurologic:  Alert and  oriented x4; diffusely weak, otherwise grossly normal neurologically. Skin:  Intact without significant lesions or rashes. Psych:  Alert and cooperative. Normal mood and affect.   Lab Results: Recent Labs    07/22/23 0050 07/22/23 0349 07/22/23 1246  WBC 9.3 8.8  --   HGB 10.2* 10.2* 8.4*  HCT 32.9* 32.5* 27.2*  PLT 251 254  --    BMET Recent Labs    07/22/23 0050 07/22/23 0349  NA 138 140  K 4.3 4.6  CL 108 107  CO2 22 23  GLUCOSE 95 102*  BUN 38* 39*  CREATININE 0.75 0.72  CALCIUM 8.9 9.0   LFT Recent Labs    07/22/23 0349  PROT 6.3*  ALBUMIN 3.1*  AST 18  ALT 14  ALKPHOS 49  BILITOT 0.5   PT/INR Recent Labs    07/22/23 0200  LABPROT 15.2  INR 1.2    Studies/Results: No results found.  Impression:  Hematemesis, recurrent (similar prior episode in 2012).  Dark stools.  Suspect  ulcer from NSAIDs.  No hematemesis for over 12 hours. Acute blood loss anemia.  Plan:   Clear liquid diet, NPO after midnight. PPI. Serial CBCs, transfuse as needed. No ASA/NSAIDs. Endoscopy tomorrow. Risks (bleeding, infection, bowel perforation that could require surgery, sedation-related changes in cardiopulmonary systems), benefits (identification and possible treatment of source of symptoms, exclusion of certain causes of symptoms), and alternatives (watchful waiting, radiographic imaging studies, empiric medical treatment) of upper endoscopy (EGD) were explained to patient/family in detail and patient wishes to proceed.  [facility] will follow.   LOS: 0 days   Teiara Baria M  07/22/2023, 1:27 PM  Cell [phone] If no answer or after 5 PM call [phone]

## 2023-07-23 ENCOUNTER — Other Ambulatory Visit (HOSPITAL_COMMUNITY): Payer: Self-pay

## 2023-07-23 ENCOUNTER — Observation Stay (HOSPITAL_COMMUNITY): Admitting: Certified Registered Nurse Anesthetist

## 2023-07-23 ENCOUNTER — Encounter (HOSPITAL_COMMUNITY)
Admission: EM | Disposition: A | Discharge status: Home / Self Care | Payer: Self-pay | Source: Home / Self Care | Attending: Emergency Medicine

## 2023-07-23 DIAGNOSIS — K922 Gastrointestinal hemorrhage, unspecified: Secondary | ICD-10-CM | POA: Diagnosis not present

## 2023-07-23 DIAGNOSIS — K2980 Duodenitis without bleeding: Secondary | ICD-10-CM

## 2023-07-23 DIAGNOSIS — K259 Gastric ulcer, unspecified as acute or chronic, without hemorrhage or perforation: Secondary | ICD-10-CM | POA: Diagnosis not present

## 2023-07-23 DIAGNOSIS — K209 Esophagitis, unspecified without bleeding: Secondary | ICD-10-CM | POA: Diagnosis not present

## 2023-07-23 DIAGNOSIS — K3189 Other diseases of stomach and duodenum: Secondary | ICD-10-CM | POA: Diagnosis not present

## 2023-07-23 HISTORY — PX: ESOPHAGOGASTRODUODENOSCOPY: SHX5428

## 2023-07-23 LAB — COMPREHENSIVE METABOLIC PANEL WITH GFR
ALT: 13 U/L (ref 0–44)
AST: 16 U/L (ref 15–41)
Albumin: 2.9 g/dL — ABNORMAL LOW (ref 3.5–5.0)
Alkaline Phosphatase: 47 U/L (ref 38–126)
Anion gap: 7 (ref 5–15)
BUN: 18 mg/dL (ref 6–20)
CO2: 22 mmol/L (ref 22–32)
Calcium: 8.1 mg/dL — ABNORMAL LOW (ref 8.9–10.3)
Chloride: 109 mmol/L (ref 98–111)
Creatinine, Ser: 0.72 mg/dL (ref 0.44–1.00)
GFR, Estimated: >60 mL/min (ref >60)
Glucose, Bld: 70 mg/dL (ref 70–99)
Potassium: 3.4 mmol/L — ABNORMAL LOW (ref 3.5–5.1)
Sodium: 138 mmol/L (ref 135–145)
Total Bilirubin: 0.4 mg/dL (ref 0.0–1.2)
Total Protein: 5.6 g/dL — ABNORMAL LOW (ref 6.5–8.1)

## 2023-07-23 LAB — CBC
HCT: 27.5 % — ABNORMAL LOW (ref 36.0–46.0)
Hemoglobin: 8.7 g/dL — ABNORMAL LOW (ref 12.0–15.0)
MCH: 28.6 pg (ref 26.0–34.0)
MCHC: 31.6 g/dL (ref 30.0–36.0)
MCV: 90.5 fL (ref 80.0–100.0)
Platelets: 206 10*3/uL (ref 150–400)
RBC: 3.04 MIL/uL — ABNORMAL LOW (ref 3.87–5.11)
RDW: 15.8 % — ABNORMAL HIGH (ref 11.5–15.5)
WBC: 4.5 10*3/uL (ref 4.0–10.5)
nRBC: 0 % (ref 0.0–0.2)

## 2023-07-23 SURGERY — EGD (ESOPHAGOGASTRODUODENOSCOPY)
Anesthesia: Monitor Anesthesia Care | Laterality: Left

## 2023-07-23 MED ORDER — THIAMINE HCL 100 MG PO TABS
100.0000 mg | ORAL_TABLET | Freq: Every day | ORAL | 0 refills | Status: DC
  Filled 2023-07-23: qty 30, 30d supply, fill #0

## 2023-07-23 MED ORDER — FENTANYL CITRATE (PF) 100 MCG/2ML IJ SOLN: 25.0000 ug | INTRAMUSCULAR | Status: DC | PRN

## 2023-07-23 MED ORDER — PANTOPRAZOLE SODIUM 40 MG PO TBEC
40.0000 mg | DELAYED_RELEASE_TABLET | Freq: Two times a day (BID) | ORAL | 0 refills | Status: AC
  Filled 2023-07-23: qty 90, 45d supply, fill #0

## 2023-07-23 MED ORDER — FOLIC ACID 1 MG PO TABS
1.0000 mg | ORAL_TABLET | Freq: Every day | ORAL | 0 refills | Status: DC
  Filled 2023-07-23: qty 30, 30d supply, fill #0

## 2023-07-23 MED ORDER — LIDOCAINE 2% (20 MG/ML) 5 ML SYRINGE
INTRAMUSCULAR | Status: DC | PRN
Start: 2023-07-23 — End: 2023-07-23
  Administered 2023-07-23: 50 mg via INTRAVENOUS

## 2023-07-23 MED ORDER — PANTOPRAZOLE SODIUM 40 MG PO TBEC: 40.0000 mg | DELAYED_RELEASE_TABLET | Freq: Two times a day (BID) | ORAL | Status: DC

## 2023-07-23 MED ORDER — OXYCODONE HCL 5 MG/5ML PO SOLN: 5.0000 mg | Freq: Once | ORAL | Status: DC | PRN

## 2023-07-23 MED ORDER — PROPOFOL 500 MG/50ML IV EMUL
INTRAVENOUS | Status: DC | PRN
  Administered 2023-07-23 (×2): 20 mg via INTRAVENOUS
  Administered 2023-07-23: 125 ug/kg/min via INTRAVENOUS
  Administered 2023-07-23 (×2): 20 mg via INTRAVENOUS
  Administered 2023-07-23: 40 mg via INTRAVENOUS

## 2023-07-23 MED ORDER — MIDAZOLAM HCL 2 MG/2ML IJ SOLN: 0.5000 mg | Freq: Once | INTRAMUSCULAR | Status: DC | PRN

## 2023-07-23 MED ORDER — SODIUM CHLORIDE 0.9 % IV SOLN
INTRAVENOUS | Status: DC | PRN
Start: 2023-07-23 — End: 2023-07-23

## 2023-07-23 MED ORDER — EPHEDRINE SULFATE-NACL 50-0.9 MG/10ML-% IV SOSY
PREFILLED_SYRINGE | INTRAVENOUS | Status: DC | PRN
  Administered 2023-07-23 (×3): 5 mg via INTRAVENOUS

## 2023-07-23 MED ORDER — MEPERIDINE HCL 25 MG/ML IJ SOLN: 6.2500 mg | INTRAMUSCULAR | Status: DC | PRN

## 2023-07-23 MED ORDER — OXYCODONE HCL 5 MG PO TABS: 5.0000 mg | ORAL_TABLET | Freq: Once | ORAL | Status: DC | PRN

## 2023-07-23 NOTE — TOC Transition Note (Addendum)
 Transition of Care [facility]) - Discharge Note   Patient Details  Name: Shawna Mcbride MRN: [id number] Date of Birth: 10-31-1965  Transition of Care [facility]) CM/SW Contact:  Eusebio High, RN Phone Number: 07/23/2023, 2:33 PM   Clinical Narrative:     Patient will DC to home today with Husband. PCP requested. Appointment information will be mailed. . Smoking cessation information has been attached to DC instructions No additional TOC needs           Patient Goals and CMS Choice            Discharge Placement                       Discharge Plan and Services Additional resources added to the After Visit Summary for                                       Social Drivers of Health (SDOH) Interventions SDOH Screenings   Food Insecurity: No Food Insecurity (07/22/2023)  Housing: Unknown (07/22/2023)  Transportation Needs: No Transportation Needs (07/22/2023)  Utilities: Not At Risk (07/22/2023)  Social Connections: Moderately Integrated (07/22/2023)  Tobacco Use: High Risk (07/22/2023)     Readmission Risk Interventions     No data to display

## 2023-07-23 NOTE — Interval H&P Note (Signed)
 History and Physical Interval Note:  07/23/2023 9:16 AM  Shawna Mcbride  has presented today for surgery, with the diagnosis of hematemesis, blood in stool.  The various methods of treatment have been discussed with the patient and family. After consideration of risks, benefits and other options for treatment, the patient has consented to  Procedure(s): EGD (ESOPHAGOGASTRODUODENOSCOPY) (Left) as a surgical intervention.  The patient's history has been reviewed, patient examined, no change in status, stable for surgery.  I have reviewed the patient's chart and labs.  Questions were answered to the patient's satisfaction.     [name]

## 2023-07-23 NOTE — Plan of Care (Signed)

## 2023-07-23 NOTE — Plan of Care (Signed)
 Pt a/o, VS stable, AVS printed and given to the patient. Education provided (including to avoid NSAIDs, Alcohol), all questions were answered. Pt transferred off the floor with belonging. TOC Medication was taken on the way out.    Problem: Education: Goal: Knowledge of General Education information will improve Description: Including pain rating scale, medication(s)/side effects and non-pharmacologic comfort measures 07/23/2023 1422 by Lowanda Ruddy, RN Outcome: Adequate for Discharge 07/23/2023 1422 by Lowanda Ruddy, RN Outcome: Adequate for Discharge   Problem: Health Behavior/Discharge Planning: Goal: Ability to manage health-related needs will improve 07/23/2023 1422 by Lowanda Ruddy, RN Outcome: Adequate for Discharge 07/23/2023 1422 by Lowanda Ruddy, RN Outcome: Adequate for Discharge   Problem: Clinical Measurements: Goal: Ability to maintain clinical measurements within normal limits will improve 07/23/2023 1422 by Lowanda Ruddy, RN Outcome: Adequate for Discharge 07/23/2023 1422 by Lowanda Ruddy, RN Outcome: Adequate for Discharge Goal: Will remain free from infection 07/23/2023 1422 by Lowanda Ruddy, RN Outcome: Adequate for Discharge 07/23/2023 1422 by Lowanda Ruddy, RN Outcome: Adequate for Discharge Goal: Diagnostic test results will improve 07/23/2023 1422 by Lowanda Ruddy, RN Outcome: Adequate for Discharge 07/23/2023 1422 by Lowanda Ruddy, RN Outcome: Adequate for Discharge Goal: Respiratory complications will improve 07/23/2023 1422 by Lowanda Ruddy, RN Outcome: Adequate for Discharge 07/23/2023 1422 by Lowanda Ruddy, RN Outcome: Adequate for Discharge Goal: Cardiovascular complication will be avoided 07/23/2023 1422 by Lowanda Ruddy, RN Outcome: Adequate for Discharge 07/23/2023 1422 by Lowanda Ruddy, RN Outcome: Adequate for Discharge   Problem: Activity: Goal: Risk for activity intolerance will decrease 07/23/2023 1422 by  Lowanda Ruddy, RN Outcome: Adequate for Discharge 07/23/2023 1422 by Lowanda Ruddy, RN Outcome: Adequate for Discharge   Problem: Nutrition: Goal: Adequate nutrition will be maintained 07/23/2023 1422 by Lowanda Ruddy, RN Outcome: Adequate for Discharge 07/23/2023 1422 by Lowanda Ruddy, RN Outcome: Adequate for Discharge   Problem: Coping: Goal: Level of anxiety will decrease 07/23/2023 1422 by Lowanda Ruddy, RN Outcome: Adequate for Discharge 07/23/2023 1422 by Lowanda Ruddy, RN Outcome: Adequate for Discharge   Problem: Elimination: Goal: Will not experience complications related to bowel motility 07/23/2023 1422 by Lowanda Ruddy, RN Outcome: Adequate for Discharge 07/23/2023 1422 by Lowanda Ruddy, RN Outcome: Adequate for Discharge Goal: Will not experience complications related to urinary retention 07/23/2023 1422 by Lowanda Ruddy, RN Outcome: Adequate for Discharge 07/23/2023 1422 by Lowanda Ruddy, RN Outcome: Adequate for Discharge   Problem: Pain Managment: Goal: General experience of comfort will improve and/or be controlled 07/23/2023 1422 by Lowanda Ruddy, RN Outcome: Adequate for Discharge 07/23/2023 1422 by Lowanda Ruddy, RN Outcome: Adequate for Discharge   Problem: Safety: Goal: Ability to remain free from injury will improve 07/23/2023 1422 by Lowanda Ruddy, RN Outcome: Adequate for Discharge 07/23/2023 1422 by Lowanda Ruddy, RN Outcome: Adequate for Discharge

## 2023-07-23 NOTE — Op Note (Signed)
 [facility] Patient Name: Shawna Mcbride Procedure Date : 07/23/2023 MRN: [id number] Attending MD: Evangeline Hilts , MD, [id number] Date of Birth: 09-13-1965 CSN: [id number] Age: 58 Admit Type: Inpatient Procedure:                Upper GI endoscopy Indications:              Acute post hemorrhagic anemia, Hematemesis, Melena Providers:                Evangeline Hilts, MD, Bradley Caffey, Joline Ned,                            Technician Referring MD:             [facility]. Medicines:                Monitored Anesthesia Care Complications:            No immediate complications. Estimated Blood Loss:     Estimated blood loss: none. Procedure:                Pre-Anesthesia Assessment:                           - Prior to the procedure, a History and Physical                            was performed, and patient medications and                            allergies were reviewed. The patient's tolerance of                            previous anesthesia was also reviewed. The risks                            and benefits of the procedure and the sedation                            options and risks were discussed with the patient.                            All questions were answered, and informed consent                            was obtained. Prior Anticoagulants: The patient has                            taken no anticoagulant or antiplatelet agents                            except for NSAID medication. ASA Grade Assessment:                            II - A patient with mild systemic disease. After  reviewing the risks and benefits, the patient was                            deemed in satisfactory condition to undergo the                            procedure.                           After obtaining informed consent, the endoscope was                            passed under direct vision. Throughout the                             procedure, the patient's blood pressure, pulse, and                            oxygen saturations were monitored continuously. The                            GIF-H190 ([id number]) Olympus endoscope was introduced                            through the mouth, and advanced to the second part                            of duodenum. The upper GI endoscopy was                            accomplished without difficulty. The patient                            tolerated the procedure well. Scope In: Scope Out: Findings:      LA Grade A (one or more mucosal breaks less than 5 mm, not extending       between tops of 2 mucosal folds) esophagitis with no bleeding was found.      The exam of the esophagus was otherwise normal.      A few localized small erosions with no bleeding and no stigmata of       recent bleeding were found in the prepyloric region of the stomach.      The exam of the stomach was otherwise normal.      One non-bleeding cratered duodenal ulcer with no stigmata of bleeding       was found in the duodenal bulb. The lesion was 5 mm in largest dimension.      Diffuse mild inflammation was found in the duodenal bulb.      Diffuse moderately congested mucosa without active bleeding and with no       stigmata of bleeding was found in the duodenal bulb.      No old or fresh blood was seen to the extent of our examination. Impression:               - LA Grade A esophagitis with no bleeding.                           -  Erosive gastropathy with no bleeding and no                            stigmata of recent bleeding. Previous biopsies for                            endoscopy done years ago for similar reasons showed                            no H. pylori.                           - Non-bleeding duodenal ulcer with no stigmata of                            bleeding.                           - Duodenitis.                           - Congested duodenal mucosa.                           -  No active bleeding. Recommendation:           - Return patient to hospital ward for ongoing care.                           - Soft diet today.                           - Continue present medications.                           - Pantoprazole  40 mg po bid x 6 weeks, then 40 mg                            po qd thereafter indefinitely.                           - No ASA/NSAIDs, including NO BC/GOODY's powders.                           - OK for patient to be discharged home today from                            GI perspective.                           [facility] will sign-off; we can arrange outpatient                            follow-up with us ; will likely need repeat  endoscopy as well as colonoscopy for screening;                            please call with any questions; thank you for the                            consultation. Procedure Code(s):        --- Professional ---                           [phone], Esophagogastroduodenoscopy, flexible,                            transoral; diagnostic, including collection of                            specimen(s) by brushing or washing, when performed                            (separate procedure) Diagnosis Code(s):        --- Professional ---                           K20.90, Esophagitis, unspecified without bleeding                           K31.89, Other diseases of stomach and duodenum                           K26.9, Duodenal ulcer, unspecified as acute or                            chronic, without hemorrhage or perforation                           K29.80, Duodenitis without bleeding                           D62, Acute posthemorrhagic anemia                           K92.0, Hematemesis                           K92.1, Melena (includes Hematochezia) CPT copyright 2022 American Medical Association. All rights reserved. The codes documented in this report are preliminary and upon coder review may  be revised  to meet current compliance requirements. Evangeline Hilts, MD 07/23/2023 10:02:18 AM This report has been signed electronically. Number of Addenda: 0

## 2023-07-23 NOTE — Anesthesia Preprocedure Evaluation (Addendum)
 Anesthesia Evaluation  Patient identified by MRN, date of birth, ID band Patient awake    Reviewed: Allergy & Precautions, NPO status , Patient's Chart, lab work & pertinent test results  History of Anesthesia Complications Negative for: history of anesthetic complications  Airway Mallampati: I  TM Distance: >3 FB Neck ROM: Full    Dental  (+) Dental Advisory Given, Edentulous Upper, Missing   Pulmonary Current Smoker and Patient abstained from smoking.   breath sounds clear to auscultation       Cardiovascular negative cardio ROS  Rhythm:Regular Rate:Normal     Neuro/Psych negative neurological ROS     GI/Hepatic ,GERD  Medicated,,(+)     substance abuse  alcohol use and marijuana useGI bleed   Endo/Other  negative endocrine ROS    Renal/GU negative Renal ROS     Musculoskeletal  (+) Arthritis ,    Abdominal   Peds  Hematology  (+) Blood dyscrasia (Hb 8.7, plt 206k), anemia   Anesthesia Other Findings   Reproductive/Obstetrics                             Anesthesia Physical Anesthesia Plan  ASA: 3  Anesthesia Plan: MAC   Post-op Pain Management: Minimal or no pain anticipated   Induction:   PONV Risk Score and Plan: 1 and Treatment may vary due to age or medical condition  Airway Management Planned: Natural Airway and Simple Face Mask  Additional Equipment: None  Intra-op Plan:   Post-operative Plan:   Informed Consent: I have reviewed the patients History and Physical, chart, labs and discussed the procedure including the risks, benefits and alternatives for the proposed anesthesia with the patient or authorized representative who has indicated his/her understanding and acceptance.     Dental advisory given  Plan Discussed with: CRNA and Surgeon  Anesthesia Plan Comments:        Anesthesia Quick Evaluation

## 2023-07-23 NOTE — Transfer of Care (Signed)
 Immediate Anesthesia Transfer of Care Note  Patient: Shawna Mcbride  Procedure(s) Performed: EGD (ESOPHAGOGASTRODUODENOSCOPY) (Left)  Patient Location: Endoscopy Unit  Anesthesia Type:MAC  Level of Consciousness: awake, alert , oriented, drowsy, and patient cooperative  Airway & Oxygen Therapy: Patient Spontanous Breathing  Post-op Assessment: Report given to RN and Post -op Vital signs reviewed and stable  Post vital signs: Reviewed and stable  Last Vitals:  Vitals Value Taken Time  BP 90/62 07/23/23 09:41  Temp 35.8 C 07/23/23 09:40  Pulse 72 07/23/23 09:41  Resp 27 07/23/23 09:41  SpO2 98 % 07/23/23 09:41  Vitals shown include unfiled device data.  Last Pain:  Vitals:   07/23/23 0940  TempSrc: Temporal  PainSc: 0-No pain      Patients Stated Pain Goal: 2 (07/23/23 0857)  Complications: No notable events documented.

## 2023-07-23 NOTE — Discharge Instructions (Signed)
Follow with Primary MD in 7 days   Get CBC, CMP,checked  by Primary MD next visit.    Activity: As tolerated with Full fall precautions use walker/cane & assistance as needed   Disposition Home    Diet: regular diet   On your next visit with your primary care physician please Get Medicines reviewed and adjusted.   Please request your Prim.MD to go over all Hospital Tests and Procedure/Radiological results at the follow up, please get all Hospital records sent to your Prim MD by signing hospital release before you go home.   If you experience worsening of your admission symptoms, develop shortness of breath, life threatening emergency, suicidal or homicidal thoughts you must seek medical attention immediately by calling 911 or calling your MD immediately  if symptoms less severe.  You Must read complete instructions/literature along with all the possible adverse reactions/side effects for all the Medicines you take and that have been prescribed to you. Take any new Medicines after you have completely understood and accpet all the possible adverse reactions/side effects.   Do not drive, operating heavy machinery, perform activities at heights, swimming or participation in water activities or provide baby sitting services if your were admitted for syncope or siezures until you have seen by Primary MD or a Neurologist and advised to do so again.  Do not drive when taking Pain medications.    Do not take more than prescribed Pain, Sleep and Anxiety Medications  Special Instructions: If you have smoked or chewed Tobacco  in the last 2 yrs please stop smoking, stop any regular Alcohol  and or any Recreational drug use.  Wear Seat belts while driving.   Please note  You were cared for by a hospitalist during your hospital stay. If you have any questions about your discharge medications or the care you received while you were in the hospital after you are discharged, you can call the  unit and asked to speak with the hospitalist on call if the hospitalist that took care of you is not available. Once you are discharged, your primary care physician will handle any further medical issues. Please note that NO REFILLS for any discharge medications will be authorized once you are discharged, as it is imperative that you return to your primary care physician (or establish a relationship with a primary care physician if you do not have one) for your aftercare needs so that they can reassess your need for medications and monitor your lab values.     

## 2023-07-23 NOTE — Discharge Summary (Signed)
 Physician Discharge Summary  Shawna Mcbride MWU:[id number] DOB: 1965-08-06 DOA: 07/22/2023  PCP: Shawna Mcbride, No Pcp Per  Admit date: 07/22/2023 Discharge date: 07/23/2023  Admitted From: (Home,) Disposition:  (Home )  Recommendations for Outpatient Follow-up:  Follow up with PCP in 1-2 weeks Please obtain BMP/CBC in one week Please avoid all NSAIDs Please abstain from alcohol use Please start on p.o. iron supplement in 2 to 3 weeks   Diet recommendation: soft  Brief/Interim Summary: 58 y.o. female with medical history significant for chronic alcohol abuse, who is admitted to [facility] on 07/22/2023 with acute upper GI bleed after presenting from home to [facility] ED complaining of hematemesis.  She endorses heavy NSAID use, as well as heavy alcohol consumption, uses 3 packs of Goody's powder per day, his workup in ED significant for anemia, so she is admitted for further workup.  Acute blood loss anemia due to upper GI bleed LA Grade A esophagitis with no bleeding. Erosive gastropathy  duodenal ulcer with no stigmata of bleeding. Duodenitis -Previous EGD reviewed, notable for gastric ulcers -Pt admits to continued Goody Powder use - Started on IV Protonix , and octreotide drip given her history of heavy alcohol use, GI input greatly appreciated, status post endoscopy 6/17 by Dr. Kimble Pennant significant for:  - LA Grade A esophagitis with no bleeding.                           - Erosive gastropathy with no bleeding and no                            stigmata of recent bleeding. Previous biopsies for                            endoscopy done years ago for similar reasons showed                            no H. pylori.                           - Non-bleeding duodenal ulcer with no stigmata of                            bleeding.                           - Duodenitis - She will be discharged on Protonix  40 mg p.o. twice daily for 45 days then 40 mg daily, she was instructed to avoid all forms  of aspirin, and NSAIDs including BC powders, and to avoid alcohol use as well. -Low ferritin level, would recommend starting iron supplements as an outpatient in 1 to 2 weeks (to avoid current confusion if her stool comes dark due to p.o. iron in the setting of acute GI bleed.      Chronic Alcohol Abuse:  -no evidence of withdrawals at this time she was on CIWA protocol during hospital stay, continue with thiamine and folic acid on discharge - Counseled about cessation  Underweight - Body mass index is 17.7 kg/m.   Chronic tobacco abuse:  -reportedly continues to smoke half pack per day .   Hypokalemia - Replaced   Discharge Diagnoses:  Principal Problem:  Acute upper GI bleed Active Problems:   Normocytic anemia   Chronic alcohol abuse   Tobacco abuse    Discharge Instructions  Discharge Instructions     Diet - low sodium heart healthy   Complete by: As directed    Discharge instructions   Complete by: As directed    Increase activity slowly   Complete by: As directed       Allergies as of 07/23/2023       Reactions   Nsaids Other (See Comments)   Ulcers, takes goody or BC   Sulfa Antibiotics Hives        Medication List     STOP taking these medications    ARTHRITIS STRENGTH BC POWDER PO   lansoprazole 15 MG capsule Commonly known as: PREVACID       TAKE these medications    BLACK COHOSH PO Take 2 tablets by mouth daily at 12 noon.   cetirizine 10 MG tablet Commonly known as: ZYRTEC Take 10 mg by mouth daily at 12 noon.   Cyanocobalamin 500 MCG Chew Chew 1 tablet by mouth daily at 12 noon.   folic acid 1 MG tablet Commonly known as: FOLVITE Take 1 tablet (1 mg total) by mouth daily. Start taking on: July 24, 2023   Multivitamin Women 50+ Tabs Take 1 tablet by mouth daily.   Osteo Bi-Flex Triple Strength Tabs Take 0.5 tablets by mouth daily at 12 noon.   pantoprazole  40 MG tablet Commonly known as: PROTONIX  Take 1 tablet (40  mg total) by mouth 2 (two) times daily before a meal.   St Johns Wort 300 MG Caps Take 1 capsule by mouth daily at 12 noon.   thiamine 100 MG tablet Commonly known as: Vitamin B-1 Take 1 tablet (100 mg total) by mouth daily. Start taking on: July 24, 2023   vitamin E 180 MG (400 UNITS) capsule Take 400 Units by mouth daily at 12 noon.        Allergies  Allergen Reactions   Nsaids Other (See Comments)    Ulcers, takes goody or BC   Sulfa Antibiotics Hives    Consultations: Gastroenterology   Procedures/Studies: [place] by Dr. Kimble Pennant 6/17  Subjective: No acute events overnight, she denies any complaints this morning, no nausea, vomiting or bowel movement since admission as discussed with her this morning  Discharge Exam: Vitals:   07/23/23 1045 07/23/23 1100  BP:    Pulse: 73   Resp:  16  Temp:    SpO2:  98%   Vitals:   07/23/23 1010 07/23/23 1040 07/23/23 1045 07/23/23 1100  BP: 102/70 110/68    Pulse: (!) 59 73 73   Resp: 16   16  Temp:      TempSrc:      SpO2: 98%   98%  Weight:      Height:        General: Pt is alert, awake, not in acute distress Cardiovascular: RRR, S1/S2 +, no rubs, no gallops Respiratory: CTA bilaterally, no wheezing, no rhonchi Abdominal: Soft, NT, ND, bowel sounds + Extremities: no edema, no cyanosis  [place]  chaperone her RN Maria at bedside   The results of significant diagnostics from this hospitalization (including imaging, microbiology, ancillary and laboratory) are listed below for reference.     Microbiology: No results found for this or any previous visit (from the past 240 hours).   Labs: BNP (last 3 results) No results for input(s): BNP in the last 8760  hours. Basic Metabolic Panel: Recent Labs  Lab 07/22/23 0050 07/22/23 0349 07/23/23 0500  NA 138 140 138  K 4.3 4.6 3.4*  CL 108 107 109  CO2 22 23 22   GLUCOSE 95 102* 70  BUN 38* 39* 18  CREATININE 0.75 0.72 0.72  CALCIUM 8.9 9.0 8.1*  MG  --   2.0  --    Liver Function Tests: Recent Labs  Lab 07/22/23 0050 07/22/23 0349 07/23/23 0500  AST 16 18 16   ALT 13 14 13   ALKPHOS 61 49 47  BILITOT 0.3 0.5 0.4  PROT 5.9* 6.3* 5.6*  ALBUMIN 2.9* 3.1* 2.9*   No results for input(s): LIPASE, AMYLASE in the last 168 hours. No results for input(s): AMMONIA in the last 168 hours. CBC: Recent Labs  Lab 07/22/23 0050 07/22/23 0349 07/22/23 1246 07/22/23 1806 07/23/23 0500  WBC 9.3 8.8  --   --  4.5  NEUTROABS  --  6.6  --   --   --   HGB 10.2* 10.2* 8.4* 9.1* 8.7*  HCT 32.9* 32.5* 27.2* 29.2* 27.5*  MCV 91.4 91.5  --   --  90.5  PLT 251 254  --   --  206   Cardiac Enzymes: No results for input(s): CKTOTAL, CKMB, CKMBINDEX, TROPONINI in the last 168 hours. BNP: Invalid input(s): POCBNP CBG: No results for input(s): GLUCAP in the last 168 hours. D-Dimer No results for input(s): DDIMER in the last 72 hours. Hgb A1c No results for input(s): HGBA1C in the last 72 hours. Lipid Profile No results for input(s): CHOL, HDL, LDLCALC, TRIG, CHOLHDL, LDLDIRECT in the last 72 hours. Thyroid function studies No results for input(s): TSH, T4TOTAL, T3FREE, THYROIDAB in the last 72 hours.  Invalid input(s): FREET3 Anemia work up Recent Labs    07/22/23 0349  VITAMINB12 1,703*  FOLATE 22.9  FERRITIN 12  TIBC 447  IRON 132   Urinalysis    Component Value Date/Time   COLORURINE YELLOW 03/14/2015 2048   APPEARANCEUR CLOUDY (A) 03/14/2015 2048   LABSPEC 1.021 03/14/2015 2048   PHURINE 5.0 03/14/2015 2048   GLUCOSEU NEGATIVE 03/14/2015 2048   HGBUR NEGATIVE 03/14/2015 2048   BILIRUBINUR NEGATIVE 03/14/2015 2048   KETONESUR 15 (A) 03/14/2015 2048   PROTEINUR NEGATIVE 03/14/2015 2048   UROBILINOGEN 0.2 09/08/2009 1959   NITRITE NEGATIVE 03/14/2015 2048   LEUKOCYTESUR TRACE (A) 03/14/2015 2048   Sepsis Labs Recent Labs  Lab 07/22/23 0050 07/22/23 0349 07/23/23 0500  WBC 9.3 8.8  4.5   Microbiology No results found for this or any previous visit (from the past 240 hours).   Time coordinating discharge: Over 30 minutes  SIGNED:   Seena Dadds, MD  [facility] 07/23/2023, 12:21 PM Pager   If 7PM-7AM, please contact night-coverage [email or web address] Password [email or web address]

## 2023-07-23 NOTE — Anesthesia Postprocedure Evaluation (Signed)
 Anesthesia Post Note  Patient: Shawna Mcbride  Procedure(s) Performed: EGD (ESOPHAGOGASTRODUODENOSCOPY) (Left)     Patient location during evaluation: Endoscopy Anesthesia Type: MAC Level of consciousness: oriented, awake and alert and patient cooperative Pain management: pain level controlled Vital Signs Assessment: post-procedure vital signs reviewed and stable Respiratory status: spontaneous breathing, nonlabored ventilation and respiratory function stable Cardiovascular status: blood pressure returned to baseline and stable Postop Assessment: no apparent nausea or vomiting Anesthetic complications: no  No notable events documented.  Last Vitals:  Vitals:   07/23/23 1045 07/23/23 1100  BP:    Pulse: 73   Resp:  16  Temp:    SpO2:  98%    Last Pain:  Vitals:   07/23/23 1000  TempSrc:   PainSc: 0-No pain                 Pavle Wiler,E. Estefana Taylor

## 2023-07-26 ENCOUNTER — Encounter (HOSPITAL_COMMUNITY): Payer: Self-pay | Admitting: Gastroenterology

## 2023-08-01 ENCOUNTER — Encounter (INDEPENDENT_AMBULATORY_CARE_PROVIDER_SITE_OTHER): Payer: Self-pay

## 2023-08-06 ENCOUNTER — Encounter: Payer: Self-pay | Admitting: Family

## 2023-08-06 ENCOUNTER — Ambulatory Visit (INDEPENDENT_AMBULATORY_CARE_PROVIDER_SITE_OTHER): Payer: Self-pay | Admitting: Family

## 2023-08-06 VITALS — BP 121/84 | HR 76 | Temp 98.2°F | Resp 16 | Ht 67.0 in | Wt 112.6 lb

## 2023-08-06 DIAGNOSIS — E876 Hypokalemia: Secondary | ICD-10-CM

## 2023-08-06 DIAGNOSIS — R636 Underweight: Secondary | ICD-10-CM

## 2023-08-06 DIAGNOSIS — Z7689 Persons encountering health services in other specified circumstances: Secondary | ICD-10-CM

## 2023-08-06 DIAGNOSIS — F418 Other specified anxiety disorders: Secondary | ICD-10-CM

## 2023-08-06 DIAGNOSIS — F419 Anxiety disorder, unspecified: Secondary | ICD-10-CM

## 2023-08-06 DIAGNOSIS — K2980 Duodenitis without bleeding: Secondary | ICD-10-CM

## 2023-08-06 DIAGNOSIS — Z720 Tobacco use: Secondary | ICD-10-CM

## 2023-08-06 DIAGNOSIS — K209 Esophagitis, unspecified without bleeding: Secondary | ICD-10-CM | POA: Diagnosis not present

## 2023-08-06 DIAGNOSIS — Z09 Encounter for follow-up examination after completed treatment for conditions other than malignant neoplasm: Secondary | ICD-10-CM

## 2023-08-06 DIAGNOSIS — K922 Gastrointestinal hemorrhage, unspecified: Secondary | ICD-10-CM | POA: Diagnosis not present

## 2023-08-06 DIAGNOSIS — D62 Acute posthemorrhagic anemia: Secondary | ICD-10-CM

## 2023-08-06 DIAGNOSIS — F17210 Nicotine dependence, cigarettes, uncomplicated: Secondary | ICD-10-CM

## 2023-08-06 DIAGNOSIS — F1010 Alcohol abuse, uncomplicated: Secondary | ICD-10-CM

## 2023-08-06 DIAGNOSIS — K269 Duodenal ulcer, unspecified as acute or chronic, without hemorrhage or perforation: Secondary | ICD-10-CM

## 2023-08-06 MED ORDER — ESCITALOPRAM OXALATE 10 MG PO TABS: 10.0000 mg | ORAL_TABLET | Freq: Every day | ORAL | 0 refills | Status: DC

## 2023-08-06 MED ORDER — HYDROXYZINE PAMOATE 25 MG PO CAPS: 25.0000 mg | ORAL_CAPSULE | Freq: Three times a day (TID) | ORAL | 2 refills | Status: DC | PRN

## 2023-08-06 NOTE — Progress Notes (Signed)
 Patient has not been to a MD in years.  Patient scored a 15 on PHQ-9

## 2023-08-06 NOTE — Progress Notes (Signed)
 Subjective:    Shawna Mcbride - 58 y.o. female MRN [id number]  Date of birth: 20-Jan-1966  HPI  Shawna Mcbride is to establish care and hospital discharge follow-up.   Current issues and/or concerns: Patient seen on 07/22/2023 - 07/23/2023 at [facility] for acute upper GI bleed, normocytic anemia, chronic alcohol abuse, tobacco abuse and additional diagnoses. Today patient states she is feeling improved. She denies red flag symptoms. She is doing well on medication regimen, no issues/concerns. States she does not consume NSAID's and she is no longer consuming alcohol. States she has decreased vaping dose and she is not ready to quit. States she has been a picky eater her whole life and only enjoys about 20 foods. States anxiety depression related to life and over thinking. She would like to try medication to see if this helps. She denies thoughts of self-harm, suicidal ideations, homicidal ideations.   ROS per HPI   Health Maintenance:   Health Maintenance Due  Topic Date Due   HIV Screening  Never done   Hepatitis C Screening  Never done   Pneumococcal Vaccine 49-36 Years old (1 of 2 - PCV) Never done   Cervical Cancer Screening (HPV/Pap Cotest)  Never done   Colonoscopy  Never done   MAMMOGRAM  11/05/2015     Past Medical History: Patient Active Problem List   Diagnosis Date Noted   Acute upper GI bleed 07/22/2023   Normocytic anemia 07/22/2023   Chronic alcohol abuse 07/22/2023   Tobacco abuse 07/22/2023   Blood loss anemia 01/02/2011   Leukocytosis 01/02/2011      Social History   reports that she has been smoking cigarettes. She has never used smokeless tobacco. She reports current alcohol use. She reports current drug use. Drug: Marijuana.   Family History  family history is not on file.   Medications: reviewed and updated   Objective:   Physical Exam BP 121/84   Pulse 76   Temp 98.2 F (36.8 C) (Oral)   Resp 16   Ht 5' 7 (1.702 m)   Wt  112 lb 9.6 oz (51.1 kg)   SpO2 97%   BMI 17.64 kg/m   Physical Exam HENT:     Head: Normocephalic and atraumatic.     Nose: Nose normal.     Mouth/Throat:     Mouth: Mucous membranes are moist.     Pharynx: Oropharynx is clear.   Eyes:     Extraocular Movements: Extraocular movements intact.     Conjunctiva/sclera: Conjunctivae normal.     Pupils: Pupils are equal, round, and reactive to light.    Cardiovascular:     Rate and Rhythm: Normal rate and regular rhythm.     Pulses: Normal pulses.     Heart sounds: Normal heart sounds.  Pulmonary:     Effort: Pulmonary effort is normal.     Breath sounds: Normal breath sounds.  Abdominal:     General: Abdomen is flat.     Palpations: Abdomen is soft.   Musculoskeletal:        General: Normal range of motion.     Cervical back: Normal range of motion and neck supple.   Neurological:     General: No focal deficit present.     Mental Status: She is alert and oriented to person, place, and time.   Psychiatric:        Mood and Affect: Mood normal.        Behavior: Behavior normal.  Assessment & Plan:  1. Encounter to establish care (Primary) 2. Hospital discharge follow-up - Reviewed hospital course, current medications, ensured proper follow-up in place, and addressed concerns.  - Routine screening.  - Basic Metabolic Panel - CBC  3. Gastrointestinal hemorrhage, unspecified gastrointestinal hemorrhage type 4. Esophagitis 5. Duodenal ulcer 6. Duodenitis 7. Acute blood loss anemia - Continue present management.  - Referral to Gastroenterology for evaluation/management. - Ambulatory referral to Gastroenterology  8. Chronic alcohol abuse - Patient states she no longer consumes alcohol.  9. Tobacco abuse - Counseled to quit.   10. Hypokalemia - Routine screening (see #1 and #2).  11. Underweight - Patient declined referral to weight specialist.  12. Anxiety and depression - Patient denies thoughts of  self-harm, suicidal ideations, homicidal ideations. - Escitalopram and Hydroxyzine  as prescribed. Counseled on medication adherence/adverse effects.  - Patient declined referral to Psychiatry.  - Follow-up with primary provider in 4 weeks or sooner if needed.  - escitalopram (LEXAPRO) 10 MG tablet; Take 1 tablet (10 mg total) by mouth daily.  Dispense: 90 tablet; Refill: 0 - hydrOXYzine  (VISTARIL ) 25 MG capsule; Take 1 capsule (25 mg total) by mouth every 8 (eight) hours as needed.  Dispense: 30 capsule; Refill: 2    Patient was given clear instructions to go to Emergency Department or return to medical center if symptoms don't improve, worsen, or new problems develop.The patient verbalized understanding.  I discussed the assessment and treatment plan with the patient. The patient was provided an opportunity to ask questions and all were answered. The patient agreed with the plan and demonstrated an understanding of the instructions.   The patient was advised to call back or seek an in-person evaluation if the symptoms worsen or if the condition fails to improve as anticipated.    Greig Drones, NP 08/06/2023, 3:39 PM Primary Care at [facility]

## 2023-08-07 ENCOUNTER — Ambulatory Visit: Payer: Self-pay | Admitting: Family

## 2023-08-07 DIAGNOSIS — D649 Anemia, unspecified: Secondary | ICD-10-CM

## 2023-08-07 LAB — BASIC METABOLIC PANEL WITH GFR
BUN/Creatinine Ratio: 15 (ref 9–23)
BUN: 9 mg/dL (ref 6–24)
CO2: 21 mmol/L (ref 20–29)
Calcium: 9.6 mg/dL (ref 8.7–10.2)
Chloride: 102 mmol/L (ref 96–106)
Creatinine, Ser: 0.59 mg/dL (ref 0.57–1.00)
Glucose: 86 mg/dL (ref 70–99)
Potassium: 4.5 mmol/L (ref 3.5–5.2)
Sodium: 140 mmol/L (ref 134–144)
eGFR: 105 mL/min/{1.73_m2} (ref >59)

## 2023-08-07 LAB — CBC
Hematocrit: 28.7 % — ABNORMAL LOW (ref 34.0–46.6)
Hemoglobin: 8.9 g/dL — ABNORMAL LOW (ref 11.1–15.9)
MCH: 27.7 pg (ref 26.6–33.0)
MCHC: 31 g/dL — ABNORMAL LOW (ref 31.5–35.7)
MCV: 89 fL (ref 79–97)
Platelets: 649 10*3/uL — ABNORMAL HIGH (ref 150–450)
RBC: 3.21 x10E6/uL — ABNORMAL LOW (ref 3.77–5.28)
RDW: 16.3 % — ABNORMAL HIGH (ref 11.7–15.4)
WBC: 4.6 10*3/uL (ref 3.4–10.8)

## 2023-08-07 MED ORDER — IRON (FERROUS SULFATE) 325 (65 FE) MG PO TABS: 325.0000 mg | ORAL_TABLET | Freq: Every day | ORAL | 0 refills | Status: DC

## 2023-08-08 ENCOUNTER — Telehealth: Payer: Self-pay | Admitting: Family

## 2023-08-08 NOTE — Telephone Encounter (Signed)
 Copied from CRM [id number]. Topic: General - Call Back - No Documentation >> Aug 08, 2023 12:41 PM Ameerah G wrote: Reason for CRM: patient missed a call from traundra and patient stated to call her back again.

## 2023-08-12 NOTE — Telephone Encounter (Signed)
 Patient read lab message from PCP.  Shawna Mcbride. Attempting to call back regarding the lab results.   PCP also replied in MyChart message.

## 2023-08-12 NOTE — Telephone Encounter (Signed)
 Patient is aware provider advise.

## 2023-08-12 NOTE — Telephone Encounter (Signed)
 Take only Lexapro  for now.

## 2023-08-28 ENCOUNTER — Other Ambulatory Visit: Payer: Self-pay | Admitting: Medical Genetics

## 2023-09-05 NOTE — Progress Notes (Unsigned)
 [facility] CONSULT NOTE  Patient Care Team: Lorren Greig PARAS, NP as PCP - General (Nurse Practitioner)  ASSESSMENT & PLAN:  58 y.o. female with history of chronic alcohol use referred to [facility] for anemia.  Recent records show normocytic anemia, low iron  storage, elevated B12 and normal folate.  Renal function and liver enzymes are normal.  EGD showed GI ulcers. Assessment & Plan   No orders of the defined types were placed in this encounter.   Relevant history: History of gastric bypass Last colonoscopy: Last EGD:   Cbc with blood smear. Iron , TIBC, ferritin, iron  saturation, B12, folate Reticulocyte count Haptoglobin, CMP for renal function and bilirubin, LDH SPEP, serum free light chain, immunoglobulins Referral for colonoscopy  MCV greater than 100 CBC with blood smear B12, folate, copper Reticulocytes  LFT, TSH  MCV less than 80 CBC with blood smear Iron , TIBC, ferritin, iron  saturation  All questions were answered. The patient knows to call the clinic with any problems, questions or concerns.  The total time spent in the appointment was {CHL ONC TIME VISIT - DTPQU:[id number]} encounter with patients including review of chart and various tests results, discussions about plan of care and coordination of care plan  Shawna JAYSON Chihuahua, MD 7/31/20254:53 PM   CHIEF COMPLAINTS/PURPOSE OF CONSULTATION:  Anemia  HISTORY OF PRESENTING ILLNESS:  Shawna Mcbride 58 y.o. female is here because of anemia.  Recent hospital record review, report of chronic alcohol abuse, he was admitted to [facility] on 07/22/2023 with acute upper GI bleed after presenting from home to [facility] ED complaining of hematemesis.  She endorses heavy NSAID use, as well as heavy alcohol consumption.  That time, hemoglobin was 10 and MCV was 91.  EGD report esophagitis, few localized small erosion with no bleeding.  1 nonbleeding cratered duodenal ulcer  reported.  She was discharged on Protonix .  [name] had not noticed any recent bleeding such as epistaxis, hematuria or hematochezia ***The patient denies over the counter NSAID ingestion. She is not *** on antiplatelets agents. Her last colonoscopy was *** ***She had no prior history or diagnosis of cancer. Her age appropriate screening programs are up-to-date. ***She denies any pica and eats a variety of diet. ***She never donated blood or received blood transfusion ***The patient was prescribed oral iron  supplements and she takes ***  MEDICAL HISTORY:  Past Medical History:  Diagnosis Date   Arthritis     SURGICAL HISTORY: Past Surgical History:  Procedure Laterality Date   ABDOMINAL HYSTERECTOMY     ESOPHAGOGASTRODUODENOSCOPY  01/02/2011   Procedure: ESOPHAGOGASTRODUODENOSCOPY (EGD);  Surgeon: Lamar LULLA Bunk, MD;  Location: [facility] ENDOSCOPY;  Service: Endoscopy;  Laterality: N/A;  ok to do in endo unit, regular adult endoscope   ESOPHAGOGASTRODUODENOSCOPY Left 07/23/2023   Procedure: EGD (ESOPHAGOGASTRODUODENOSCOPY);  Surgeon: Burnette Fallow, MD;  Location: [facility] ENDOSCOPY;  Service: Gastroenterology;  Laterality: Left;    SOCIAL HISTORY: Social History   Socioeconomic History   Marital status: Legally Separated    Spouse name: Not on file   Number of children: Not on file   Years of education: Not on file   Highest education level: Not on file  Occupational History   Not on file  Tobacco Use   Smoking status: Every Day    Current packs/day: 0.50    Types: Cigarettes   Smokeless tobacco: Never  Vaping Use   Vaping status: Some Days   Substances: Nicotine   Substance and Sexual Activity  Alcohol use: Yes    Comment: at least 4 beers per day   Drug use: Yes    Types: Marijuana   Sexual activity: Yes  Other Topics Concern   Not on file  Social History Narrative   Not on file   Social Drivers of Health   Financial Resource Strain: Low Risk  (08/06/2023)   Overall  Financial Resource Strain (CARDIA)    Difficulty of Paying Living Expenses: Not hard at all  Food Insecurity: No Food Insecurity (07/22/2023)   Hunger Vital Sign    Worried About Running Out of Food in the Last Year: Never true    Ran Out of Food in the Last Year: Never true  Transportation Needs: No Transportation Needs (07/22/2023)   PRAPARE - Administrator, Civil Service (Medical): No    Lack of Transportation (Non-Medical): No  Physical Activity: Insufficiently Active (08/06/2023)   Exercise Vital Sign    Days of Exercise per Week: 2 days    Minutes of Exercise per Session: 30 min  Stress: Stress Concern Present (08/06/2023)   [employer] of Occupational Health - Occupational Stress Questionnaire    Feeling of Stress: To some extent  Social Connections: Moderately Integrated (07/22/2023)   Social Connection and Isolation Panel    Frequency of Communication with Friends and Family: Three times a week    Frequency of Social Gatherings with Friends and Family: Three times a week    Attends Religious Services: More than 4 times per year    Active Member of Clubs or Organizations: Yes    Attends Banker Meetings: More than 4 times per year    Marital Status: Divorced  Intimate Partner Violence: Not At Risk (07/22/2023)   Humiliation, Afraid, Rape, and Kick questionnaire    Fear of Current or Ex-Partner: No    Emotionally Abused: No    Physically Abused: No    Sexually Abused: No    FAMILY HISTORY: Family History  Problem Relation Age of Onset   Anesthesia problems Neg Hx    Hypotension Neg Hx    Malignant hyperthermia Neg Hx    Pseudochol deficiency Neg Hx     ALLERGIES:  is allergic to nsaids and sulfa antibiotics.  MEDICATIONS:  Current Outpatient Medications  Medication Sig Dispense Refill   BLACK COHOSH PO Take 2 tablets by mouth daily at 12 noon.     cetirizine (ZYRTEC) 10 MG tablet Take 10 mg by mouth daily at 12 noon. (Patient not taking:  Reported on 08/06/2023)     cholecalciferol (VITAMIN D3) 25 MCG (1000 UNIT) tablet Take 1,000 Units by mouth daily.     Cyanocobalamin  500 MCG CHEW Chew 1 tablet by mouth daily at 12 noon.     escitalopram  (LEXAPRO ) 10 MG tablet Take 1 tablet (10 mg total) by mouth daily. 90 tablet 0   folic acid  (FOLVITE ) 1 MG tablet Take 1 tablet (1 mg total) by mouth daily. 30 tablet 0   hydrOXYzine  (VISTARIL ) 25 MG capsule Take 1 capsule (25 mg total) by mouth every 8 (eight) hours as needed. 30 capsule 2   Iron , Ferrous Sulfate , 325 (65 Fe) MG TABS Take 325 mg by mouth daily. 90 tablet 0   Misc Natural Products (OSTEO BI-FLEX TRIPLE STRENGTH) TABS Take 0.5 tablets by mouth daily at 12 noon.     Multiple Vitamins-Minerals (MULTIVITAMIN WOMEN 50+) TABS Take 1 tablet by mouth daily.     pantoprazole  (PROTONIX ) 40 MG tablet Take 1  tablet (40 mg total) by mouth 2 (two) times daily before a meal. 90 tablet 0   St Johns Wort 300 MG CAPS Take 1 capsule by mouth daily at 12 noon.     thiamine  (VITAMIN B1) 100 MG tablet Take 1 tablet (100 mg total) by mouth daily. 30 tablet 0   vitamin E 180 MG (400 UNITS) capsule Take 400 Units by mouth daily at 12 noon.     No current facility-administered medications for this visit.    REVIEW OF SYSTEMS:   All relevant systems were reviewed with the patient and are negative.  PHYSICAL EXAMINATION: ECOG PERFORMANCE STATUS: {CHL ONC ECOG PS:[id number]}  There were no vitals filed for this visit. There were no vitals filed for this visit.  GENERAL: alert, no distress and comfortable SKIN: skin color normal EYES: normal, conjunctiva are pink and non-injected, sclera clear OROPHARYNX: no exudate, no erythema, and tongue normal  NECK: supple LYMPH:  no palpable cervical, axillary lymphadenopathy LUNGS: clear to auscultation with normal breathing effort HEART: regular rate & rhythm and no murmurs and no lower extremity edema ABDOMEN: abdomen soft, non-tender and  nondistended  RADIOGRAPHIC STUDIES: I have personally reviewed the radiological images as listed and agreed with the findings in the report. No results found.

## 2023-09-06 ENCOUNTER — Ambulatory Visit: Payer: Self-pay

## 2023-09-06 ENCOUNTER — Inpatient Hospital Stay

## 2023-09-06 ENCOUNTER — Other Ambulatory Visit (HOSPITAL_COMMUNITY)
Admission: RE | Admit: 2023-09-06 | Discharge: 2023-09-06 | Disposition: A | Discharge status: Home / Self Care | Payer: Self-pay | Source: Ambulatory Visit | Attending: Medical Genetics | Admitting: Medical Genetics

## 2023-09-06 VITALS — BP 133/97 | HR 114 | Temp 97.3°F | Resp 19 | Wt 111.2 lb

## 2023-09-06 DIAGNOSIS — D508 Other iron deficiency anemias: Secondary | ICD-10-CM | POA: Diagnosis present

## 2023-09-06 DIAGNOSIS — F17210 Nicotine dependence, cigarettes, uncomplicated: Secondary | ICD-10-CM | POA: Diagnosis not present

## 2023-09-06 DIAGNOSIS — Z79899 Other long term (current) drug therapy: Secondary | ICD-10-CM | POA: Diagnosis not present

## 2023-09-06 DIAGNOSIS — D75839 Thrombocytosis, unspecified: Secondary | ICD-10-CM | POA: Insufficient documentation

## 2023-09-06 DIAGNOSIS — D649 Anemia, unspecified: Secondary | ICD-10-CM

## 2023-09-06 DIAGNOSIS — D509 Iron deficiency anemia, unspecified: Secondary | ICD-10-CM | POA: Insufficient documentation

## 2023-09-06 LAB — PROTIME-INR
INR: 0.9 (ref 0.8–1.2)
Prothrombin Time: 13.1 s (ref 11.4–15.2)

## 2023-09-06 LAB — CBC WITH DIFFERENTIAL (CANCER CENTER ONLY)
Abs Immature Granulocytes: 0.01 K/uL (ref 0.00–0.07)
Basophils Absolute: 0.1 K/uL (ref 0.0–0.1)
Basophils Relative: 1 %
Eosinophils Absolute: 0.3 K/uL (ref 0.0–0.5)
Eosinophils Relative: 7 %
HCT: 34.5 % — ABNORMAL LOW (ref 36.0–46.0)
Hemoglobin: 11.2 g/dL — ABNORMAL LOW (ref 12.0–15.0)
Immature Granulocytes: 0 %
Lymphocytes Relative: 24 %
Lymphs Abs: 1.1 K/uL (ref 0.7–4.0)
MCH: 27.3 pg (ref 26.0–34.0)
MCHC: 32.5 g/dL (ref 30.0–36.0)
MCV: 84.1 fL (ref 80.0–100.0)
Monocytes Absolute: 0.5 K/uL (ref 0.1–1.0)
Monocytes Relative: 12 %
Neutro Abs: 2.6 K/uL (ref 1.7–7.7)
Neutrophils Relative %: 56 %
Platelet Count: 395 K/uL (ref 150–400)
RBC: 4.1 MIL/uL (ref 3.87–5.11)
RDW: 17.8 % — ABNORMAL HIGH (ref 11.5–15.5)
WBC Count: 4.6 K/uL (ref 4.0–10.5)
nRBC: 0 % (ref 0.0–0.2)

## 2023-09-06 LAB — GAMMA GT: GGT: 15 U/L (ref 7–50)

## 2023-09-06 LAB — FERRITIN: Ferritin: 25 ng/mL (ref 11–307)

## 2023-09-06 LAB — DIRECT ANTIGLOBULIN TEST (NOT AT ARMC)
DAT, IgG: NEGATIVE
DAT, complement: NEGATIVE

## 2023-09-06 LAB — VITAMIN B12: Vitamin B-12: 1109 pg/mL — ABNORMAL HIGH (ref 180–914)

## 2023-09-06 LAB — LACTATE DEHYDROGENASE: LDH: 157 U/L (ref 98–192)

## 2023-09-06 LAB — FOLATE: Folate: >40 ng/mL (ref >5.9)

## 2023-09-06 NOTE — Assessment & Plan Note (Addendum)
Start oral iron °

## 2023-09-06 NOTE — Assessment & Plan Note (Deleted)
 Iron  deficiency as well Haptoglobin, GGT, LFT bilirubin, LDH, INR Follow up GI Alcohol cessation Follow up in 3-4 weeks

## 2023-09-06 NOTE — Assessment & Plan Note (Addendum)
 Repeat CBC, LFT, GGT, INR Folate and B12 as she is not taking

## 2023-09-06 NOTE — Assessment & Plan Note (Addendum)
 Suspect reactive Repeat CBC

## 2023-09-09 ENCOUNTER — Ambulatory Visit (INDEPENDENT_AMBULATORY_CARE_PROVIDER_SITE_OTHER): Admitting: Family

## 2023-09-09 ENCOUNTER — Encounter: Payer: Self-pay | Admitting: Family

## 2023-09-09 VITALS — BP 128/90 | HR 91 | Temp 97.6°F | Resp 16 | Ht 67.0 in | Wt 110.6 lb

## 2023-09-09 DIAGNOSIS — F419 Anxiety disorder, unspecified: Secondary | ICD-10-CM

## 2023-09-09 DIAGNOSIS — F32A Depression, unspecified: Secondary | ICD-10-CM

## 2023-09-09 LAB — METHYLMALONIC ACID, SERUM: Methylmalonic Acid, Quantitative: 118 nmol/L (ref 0–378)

## 2023-09-09 MED ORDER — ESCITALOPRAM OXALATE 20 MG PO TABS: 20.0000 mg | ORAL_TABLET | Freq: Every day | ORAL | 0 refills | Status: DC

## 2023-09-09 MED ORDER — HYDROXYZINE PAMOATE 50 MG PO CAPS: 50.0000 mg | ORAL_CAPSULE | Freq: Three times a day (TID) | ORAL | 1 refills | Status: DC | PRN

## 2023-09-09 NOTE — Progress Notes (Signed)
 One month follow up.  Wants to know if you can go over blood work

## 2023-09-09 NOTE — Progress Notes (Signed)
 Patient ID: Shawna Mcbride, female    DOB: 11-21-1965  MRN: [id number]  CC: Chronic Conditions Follow-Up  Subjective: Shawna Mcbride is a 58 y.o. female who presents for chronic conditions follow-up.   Her concerns today include:  - States feeling improved with Escitalopram  and Hydroxyzine  but still sometimes has anxiety and panic attacks. She is ready for referral to Psychiatry. She denies thoughts of self-harm, suicidal ideations, homicidal ideations. - I discussed with patient in detail labs from Primary Care standpoint. Patient verbalized understanding.  Patient Active Problem List   Diagnosis Date Noted   Thrombocytosis 09/06/2023   Iron  deficiency anemia 09/06/2023   Acute upper GI bleed 07/22/2023   Normocytic anemia 07/22/2023   Chronic alcohol abuse 07/22/2023   Tobacco abuse 07/22/2023   Blood loss anemia 01/02/2011   Leukocytosis 01/02/2011     Current Outpatient Medications on File Prior to Visit  Medication Sig Dispense Refill   BLACK COHOSH PO Take 2 tablets by mouth daily at 12 noon.     cholecalciferol (VITAMIN D3) 25 MCG (1000 UNIT) tablet Take 1,000 Units by mouth daily.     Cyanocobalamin  500 MCG CHEW Chew 1 tablet by mouth daily at 12 noon. (Patient not taking: Reported on 09/09/2023)     Iron , Ferrous Sulfate , 325 (65 Fe) MG TABS Take 325 mg by mouth daily. 90 tablet 0   Misc Natural Products (OSTEO BI-FLEX TRIPLE STRENGTH) TABS Take 0.5 tablets by mouth daily at 12 noon.     Multiple Vitamins-Minerals (MULTIVITAMIN WOMEN 50+) TABS Take 1 tablet by mouth daily.     pantoprazole  (PROTONIX ) 40 MG tablet Take 1 tablet (40 mg total) by mouth 2 (two) times daily before a meal. (Patient taking differently: Take 40 mg by mouth daily.) 90 tablet 0   vitamin E 180 MG (400 UNITS) capsule Take 400 Units by mouth daily at 12 noon.     No current facility-administered medications on file prior to visit.    Allergies  Allergen Reactions   Nsaids Other (See Comments)     Ulcers, takes goody or BC   Sulfa Antibiotics Hives    Social History   Socioeconomic History   Marital status: Legally Separated    Spouse name: Not on file   Number of children: Not on file   Years of education: Not on file   Highest education level: Not on file  Occupational History   Not on file  Tobacco Use   Smoking status: Every Day    Current packs/day: 0.50    Types: Cigarettes   Smokeless tobacco: Never  Vaping Use   Vaping status: Some Days   Substances: Nicotine   Substance and Sexual Activity   Alcohol use: Yes    Comment: at least 4 beers per day   Drug use: Yes    Types: Marijuana   Sexual activity: Yes  Other Topics Concern   Not on file  Social History Narrative   Not on file   Social Drivers of Health   Financial Resource Strain: Low Risk  (08/06/2023)   Overall Financial Resource Strain (CARDIA)    Difficulty of Paying Living Expenses: Not hard at all  Food Insecurity: No Food Insecurity (09/06/2023)   Hunger Vital Sign    Worried About Running Out of Food in the Last Year: Never true    Ran Out of Food in the Last Year: Never true  Transportation Needs: No Transportation Needs (09/06/2023)   PRAPARE - Transportation  Lack of Transportation (Medical): No    Lack of Transportation (Non-Medical): No  Physical Activity: Insufficiently Active (08/06/2023)   Exercise Vital Sign    Days of Exercise per Week: 2 days    Minutes of Exercise per Session: 30 min  Stress: Stress Concern Present (08/06/2023)   [employer] of Occupational Health - Occupational Stress Questionnaire    Feeling of Stress: To some extent  Social Connections: Moderately Integrated (07/22/2023)   Social Connection and Isolation Panel    Frequency of Communication with Friends and Family: Three times a week    Frequency of Social Gatherings with Friends and Family: Three times a week    Attends Religious Services: More than 4 times per year    Active Member of Clubs or  Organizations: Yes    Attends Banker Meetings: More than 4 times per year    Marital Status: Divorced  Intimate Partner Violence: Not At Risk (09/06/2023)   Humiliation, Afraid, Rape, and Kick questionnaire    Fear of Current or Ex-Partner: No    Emotionally Abused: No    Physically Abused: No    Sexually Abused: No    Family History  Problem Relation Age of Onset   Anesthesia problems Neg Hx    Hypotension Neg Hx    Malignant hyperthermia Neg Hx    Pseudochol deficiency Neg Hx     Past Surgical History:  Procedure Laterality Date   ABDOMINAL HYSTERECTOMY     ESOPHAGOGASTRODUODENOSCOPY  01/02/2011   Procedure: ESOPHAGOGASTRODUODENOSCOPY (EGD);  Surgeon: Lamar LULLA Bunk, MD;  Location: [facility] ENDOSCOPY;  Service: Endoscopy;  Laterality: N/A;  ok to do in endo unit, regular adult endoscope   ESOPHAGOGASTRODUODENOSCOPY Left 07/23/2023   Procedure: EGD (ESOPHAGOGASTRODUODENOSCOPY);  Surgeon: Burnette Fallow, MD;  Location: [facility] ENDOSCOPY;  Service: Gastroenterology;  Laterality: Left;    ROS: Review of Systems Negative except as stated above  PHYSICAL EXAM: BP (!) 128/90   Pulse 91   Temp 97.6 F (36.4 C) (Oral)   Resp 16   Ht 5' 7 (1.702 m)   Wt 110 lb 9.6 oz (50.2 kg)   SpO2 97%   BMI 17.32 kg/m   Physical Exam HENT:     Head: Normocephalic and atraumatic.     Nose: Nose normal.     Mouth/Throat:     Mouth: Mucous membranes are moist.     Pharynx: Oropharynx is clear.  Eyes:     Extraocular Movements: Extraocular movements intact.     Conjunctiva/sclera: Conjunctivae normal.     Pupils: Pupils are equal, round, and reactive to light.  Cardiovascular:     Rate and Rhythm: Normal rate and regular rhythm.     Pulses: Normal pulses.     Heart sounds: Normal heart sounds.  Pulmonary:     Effort: Pulmonary effort is normal.     Breath sounds: Normal breath sounds.  Musculoskeletal:        General: Normal range of motion.     Cervical back: Normal range  of motion and neck supple.  Neurological:     General: No focal deficit present.     Mental Status: She is alert and oriented to person, place, and time.  Psychiatric:        Mood and Affect: Mood normal.        Behavior: Behavior normal.    ASSESSMENT AND PLAN: 1. Anxiety and depression (Primary) - Patient denies thoughts of self-harm, suicidal ideations, homicidal ideations. - Increase Escitalopram  from 10  mg to 20 mg as prescribed. Counseled on medication adherence/adverse effects.  - Increase Hydroxyzine  from 25 mg to 50 mg as prescribed. Counseled on medication adherence/adverse effects.  - Referral to Psychiatry for evaluation/management.  - Follow-up with primary provider as scheduled. - escitalopram  (LEXAPRO ) 20 MG tablet; Take 1 tablet (20 mg total) by mouth daily.  Dispense: 90 tablet; Refill: 0 - hydrOXYzine  (VISTARIL ) 50 MG capsule; Take 1 capsule (50 mg total) by mouth every 8 (eight) hours as needed.  Dispense: 90 capsule; Refill: 1 - Ambulatory referral to Psychiatry   Patient was given the opportunity to ask questions.  Patient verbalized understanding of the plan and was able to repeat key elements of the plan. Patient was given clear instructions to go to Emergency Department or return to medical center if symptoms don't improve, worsen, or new problems develop.The patient verbalized understanding.   Orders Placed This Encounter  Procedures   Ambulatory referral to Psychiatry     Requested Prescriptions   Signed Prescriptions Disp Refills   escitalopram  (LEXAPRO ) 20 MG tablet 90 tablet 0    Sig: Take 1 tablet (20 mg total) by mouth daily.   hydrOXYzine  (VISTARIL ) 50 MG capsule 90 capsule 1    Sig: Take 1 capsule (50 mg total) by mouth every 8 (eight) hours as needed.    Follow-up with primary provider as scheduled.  Greig JINNY Drones, NP

## 2023-09-18 LAB — GENECONNECT MOLECULAR SCREEN: Genetic Analysis Overall Interpretation: POSITIVE — AB

## 2023-09-20 ENCOUNTER — Telehealth: Payer: Self-pay | Admitting: Medical Genetics

## 2023-09-20 DIAGNOSIS — Z1501 Genetic susceptibility to malignant neoplasm of breast: Secondary | ICD-10-CM

## 2023-09-22 ENCOUNTER — Encounter (INDEPENDENT_AMBULATORY_CARE_PROVIDER_SITE_OTHER): Payer: Self-pay

## 2023-09-24 DIAGNOSIS — Z1501 Genetic susceptibility to malignant neoplasm of breast: Secondary | ICD-10-CM | POA: Insufficient documentation

## 2023-09-24 NOTE — Telephone Encounter (Signed)
 Sent to Primary Care. Keep all scheduled appointments.

## 2023-09-24 NOTE — Telephone Encounter (Signed)
 [place] [facility] Positive Result Note 09/24/2023 2:08 PM  THIRD ATTEMPT: Confirmed I was speaking with Shawna Mcbride [id number] by using name and DOB. Informed participant the reason for this call is to provide results for the above study. Results revealed Hereditary Breast and Ovarian Syndrome. Genetic counseling was offered and participant requests a call back to schedule. All questions were answered, and participant was thanked for their time and support of the above study. Participant was encouraged to contact [facility] if they have any further questions or concerns.

## 2023-09-25 ENCOUNTER — Telehealth: Payer: Self-pay | Admitting: Medical Genetics

## 2023-09-26 ENCOUNTER — Other Ambulatory Visit (HOSPITAL_COMMUNITY): Payer: Self-pay

## 2023-09-26 ENCOUNTER — Telehealth: Payer: Self-pay | Admitting: Medical Genetics

## 2023-09-26 NOTE — Telephone Encounter (Signed)
 [place] [facility] 09/26/2023 @ 2:10 PM  Confirmed I was speaking with Shawna Mcbride [id number] by using name and DOB. Genetic counseling was offered and participant is scheduled. All questions were answered, and participant was thanked for their time and support of the above study. Participant was encouraged to contact [facility] if they have any further questions or concerns.  Jordyn Pennstrom, BS [place]  [facility] Clinical Research Specialist II Direct Dial: [phone]  Fax: [phone]

## 2023-09-26 NOTE — Telephone Encounter (Signed)
 Shawna Mcbride

## 2023-09-30 NOTE — Progress Notes (Unsigned)
 [facility] OFFICE PROGRESS NOTE  Patient Care Team: Lorren Greig PARAS, NP as PCP - General (Nurse Practitioner)  58 y.o. female with history of chronic alcohol use referred to [facility] Oncology for anemia.   Recent records show normocytic anemia, low iron  storage, elevated B12 and normal folate.  Renal function and liver enzymes are normal.  Normal LDH, DAT. EGD showed GI ulcers.   Discussed alcohol in relation to cytopenia along with other cancer risk.  Commend her on cessation of alcohol.  Iron  deficiency. Should continue oral iron . Assessment & Plan Other iron  deficiency anemia Continue ferrous sulfate  oral iron    No orders of the defined types were placed in this encounter.    Pauletta JAYSON Chihuahua, MD  INTERVAL HISTORY: Patient returns for follow-up.  Oncology History   No history exists.     PHYSICAL EXAMINATION: ECOG PERFORMANCE STATUS: {CHL ONC ECOG PS:[id number]}  There were no vitals filed for this visit. There were no vitals filed for this visit.  GENERAL: alert, no distress and comfortable SKIN: skin color normal and no bruising or petechiae or jaundice on exposed skin EYES: normal, sclera clear OROPHARYNX: no exudate  NECK: No palpable mass LYMPH:  no palpable cervical, axillary lymphadenopathy  LUNGS: clear to auscultation and percussion with normal breathing effort HEART: regular rate & rhythm  ABDOMEN: abdomen soft, non-tender and nondistended. Musculoskeletal: no edema NEURO: no focal motor/sensory deficits  Relevant data reviewed during this visit included ***

## 2023-09-30 NOTE — Assessment & Plan Note (Addendum)
 IV iron  Ferrlecit 250 mg x 2 Continue ferrous sulfate  oral iron   Follow-up in about 6 months, labs a few days before visit.

## 2023-10-01 ENCOUNTER — Inpatient Hospital Stay

## 2023-10-01 ENCOUNTER — Inpatient Hospital Stay (HOSPITAL_BASED_OUTPATIENT_CLINIC_OR_DEPARTMENT_OTHER)

## 2023-10-01 ENCOUNTER — Telehealth: Payer: Self-pay | Admitting: Pharmacy Technician

## 2023-10-01 VITALS — BP 111/88 | HR 65 | Temp 97.6°F | Resp 17 | Wt 111.6 lb

## 2023-10-01 DIAGNOSIS — D508 Other iron deficiency anemias: Secondary | ICD-10-CM | POA: Diagnosis not present

## 2023-10-01 NOTE — Telephone Encounter (Signed)
 Auth Submission: NO AUTH NEEDED Site of care: Site of care: [facility] Payer: [employer] Medication & CPT/J Code(s) submitted: Ferrlecit (Ferric Gluconate) [id number] Diagnosis Code:  Route of submission (phone, fax, portal):  Phone # Fax # Auth type: Buy/Bill PB Units/visits requested: X2 DOSES Reference number:  Approval from: 09/30/24 to 01/31/24

## 2023-10-02 ENCOUNTER — Telehealth: Payer: Self-pay

## 2023-10-02 ENCOUNTER — Ambulatory Visit: Admitting: Family Medicine

## 2023-10-02 NOTE — Telephone Encounter (Signed)
 I LVM with the details of Shawna Mcbride's 6 month follow up appointment. I asked Trinetta to return my call if she needs to re-schedule.

## 2023-10-10 ENCOUNTER — Encounter: Payer: Self-pay | Admitting: Genetic Counselor

## 2023-10-10 ENCOUNTER — Ambulatory Visit: Admitting: Genetic Counselor

## 2023-10-10 DIAGNOSIS — Z1502 Genetic susceptibility to malignant neoplasm of ovary: Secondary | ICD-10-CM

## 2023-10-10 NOTE — Progress Notes (Signed)
 PRIMARY PROVIDER:  Lorren Greig PARAS, NP  PRIMARY REASON FOR VISIT:  BRCA2 gene mutation positive in female  Shawna Mcbride, a 58 y.o. female, was seen for a [facility] genetic counseling visit as follow up to her participation in the [place] Study, part of the [employer] Research Program. Shawna Mcbride presents to clinic today to discuss the results of the genetic testing provided by the [facility] study, which did identify a pathogenic variant in the BRCA2 gene.   RELEVANT MEDICAL HISTORY:  Shawna Mcbride is a 58 y.o. female with no personal history of cancer. Her last mammogram was completed in 2017. She had a hysterectomy in the late 1990s, ovaries intact.   She is followed by hematology for a history of anemia and has been previously treated for ulcers. She has seen Dr. Burnette for EGD, but has never had a colonoscopy.   Past Medical History:  Diagnosis Date   Arthritis     Past Surgical History:  Procedure Laterality Date   ABDOMINAL HYSTERECTOMY     ESOPHAGOGASTRODUODENOSCOPY  01/02/2011   Procedure: ESOPHAGOGASTRODUODENOSCOPY (EGD);  Surgeon: Lamar LULLA Bunk, MD;  Location: [facility] ENDOSCOPY;  Service: Endoscopy;  Laterality: N/A;  ok to do in endo unit, regular adult endoscope   ESOPHAGOGASTRODUODENOSCOPY Left 07/23/2023   Procedure: EGD (ESOPHAGOGASTRODUODENOSCOPY);  Surgeon: Burnette Fallow, MD;  Location: [facility] ENDOSCOPY;  Service: Gastroenterology;  Laterality: Left;    Social History   Socioeconomic History   Marital status: Legally Separated    Spouse name: Not on file   Number of children: Not on file   Years of education: Not on file   Highest education level: Not on file  Occupational History   Not on file  Tobacco Use   Smoking status: Every Day    Current packs/day: 0.50    Types: Cigarettes   Smokeless tobacco: Never  Vaping Use   Vaping status: Some Days   Substances: Nicotine   Substance and Sexual Activity   Alcohol use: Yes    Comment: at least 4 beers per day    Drug use: Yes    Types: Marijuana   Sexual activity: Yes  Other Topics Concern   Not on file  Social History Narrative   Not on file   Social Drivers of Health   Financial Resource Strain: Low Risk  (08/06/2023)   Overall Financial Resource Strain (CARDIA)    Difficulty of Paying Living Expenses: Not hard at all  Food Insecurity: No Food Insecurity (09/06/2023)   Hunger Vital Sign    Worried About Running Out of Food in the Last Year: Never true    Ran Out of Food in the Last Year: Never true  Transportation Needs: No Transportation Needs (09/06/2023)   PRAPARE - Administrator, Civil Service (Medical): No    Lack of Transportation (Non-Medical): No  Physical Activity: Insufficiently Active (08/06/2023)   Exercise Vital Sign    Days of Exercise per Week: 2 days    Minutes of Exercise per Session: 30 min  Stress: Stress Concern Present (08/06/2023)   [employer] of Occupational Health - Occupational Stress Questionnaire    Feeling of Stress: To some extent  Social Connections: Moderately Integrated (07/22/2023)   Social Connection and Isolation Panel    Frequency of Communication with Friends and Family: Three times a week    Frequency of Social Gatherings with Friends and Family: Three times a week    Attends Religious Services: More than 4 times per year  Active Member of Clubs or Organizations: Yes    Attends Engineer, structural: More than 4 times per year    Marital Status: Divorced     FAMILY HISTORY:  We obtained a detailed, 3-generation family history.  Significant diagnoses are listed below: Family History  Problem Relation Age of Onset   Breast cancer Mother 70   Heart attack Maternal Grandfather 47 - 49   Breast cancer Maternal Grandmother 60 - 89   Stroke Half-Sister 14   Ovarian cancer Maternal Aunt 54 - 69   Cancer - Other Maternal Aunt        blood and/or bone cancer? treated with transfusion or transplant, not leukemia   Heart attack  Maternal Uncle    Lung cancer Maternal Cousin 36       metastatic to brain   Anesthesia problems Neg Hx    Hypotension Neg Hx    Malignant hyperthermia Neg Hx    Pseudochol deficiency Neg Hx     Shawna Mcbride is unaware of previous family history of genetic testing.     GENETIC TEST RESULTS:  Shawna Mcbride participated in the [facility] population genomic screening program. A single, heterozygous pathogenic variant was detected in the BRCA2 gene called [id number] p.?. There were no pathogenic or likely pathogenic variants detected in other genes reported on in this study.   [employer] [facility] Population Screen is a screening test that analyzes 11 genes related to hereditary breast and ovarian cancer syndrome (HBOC), Lynch syndrome, and familial hypercholesterolemia. These include APOB, BRCA1, BRCA2, EPCAM, LDLR, LDLRAP1, PCSK9, PMS2 (excluding exons 11-15), MLH1, MSH2, MSH6. This test reports pathogenic and likely pathogenic variants, but does not report on variants of unknown significance. The absence of any additional pathogenic variants is reassuring, but does not rule out a hereditary condition. There are other variants and genes associated with heart disease, hereditary cancer and other inherited conditions that were not included in this test. Please see full test report in labs, labeled [facility] Molecular Screen, dated 09/06/23.      CLINICAL INFORMATION:  Pathogenic variants or mutations in BRCA2 are associated with an increased risk to develop breast cancer, ovarian cancer, prostate cancer, and pancreatic cancer, and melanoma skin cancer. People with a hereditary mutation in BRCA2 have a condition called Hereditary Breast and Ovarian Cancer (HBOC) Syndrome. The cancer risks and management recommendations associated with BRCA2 mutations are listed below:  Type of Cancer BRCA2 Mutation Lifetime Risk Average Lifetime Risk  Female breast 55-69% 12%  Contralateral, female breast   25% over 20 years Less than 10% over 58 years  Female breast Up to 7.1% by age 65 0.1%  Ovarian 13-29% 1-2%  Prostate 19-61% 12%  Pancreatic 5-10% 1.6%  Melanoma Increased  1-2%   Limited data suggest there may be a slightly increased risk of serous uterine cancer in women with a BRCA mutation. Further research is needed to better understand this association.   Risk numbers are based on NCCN v1.2026. Risk estimates may evolve over time as new information is learned about BRCA2 mutations.    Management Recommendations: Per NCCN v1.2026, Genetic/Familial High Risk Assessment: Breast, Ovarian, Pancreatic, Prostate.   Breast Screening/Risk Reduction: For women (assigned female at birth):  Screening: Breast awareness starting at age 3 Clinical breast exam every 6-12 months starting at age 100, or sooner depending on family history  Annual breast MRI with contrast beginning at age 78-29 (or annual mammograms with consideration of tomosynthesis if MRI is unavailable),  although the age to initiate screening may be individualized based on family history Annual mammogram beginning at age 102 until age 33 with consideration of tomosynthesis with continuation of annual breast MRI with contrast Management should be personalized to people over the age of 58 years old  Surgery:  Prophylactic bilateral risk-reducing mastectomy (RRM), which is the removed of breast tissue before cancer develops, can reduce the risk of a breast cancer by 90% or more depending on the type of surgery. This option can be discussed with a surgeon and a plastic surgeon if breast reconstruction is completed as well.  For women with a BRCA2 pathogenic or likely pathogenic variant who are treated for breast cancer and have not had a bilateral mastectomy, screening with annual mammogram with consideration of tomosynthesis and breast MRI should continue as described above. Medication (chemoprevention): Consider breast cancer risk reducing  medications   For men (assigned female at birth):  Breast self-exam training and education starting at age 39 years Annual clinical breast exam starting at age 31 years  Consider annual mammogram starting at age 24 or 10 years before the earliest known female breast cancer in the family (whichever comes first).   Gynecological Cancer Screening/Risk Reduction: Non-surgical risk reduction: Consultation with a gynecologic oncologist is recommended Consideration of certain types of birth control, thorough discussion of benefits and risks. Oral contraceptive use has been shown to reduce the risk of ovarian cancer by approximately 60% in BRCA2 mutation carriers if taken for at least 5 years. This risk reduction remains even after discontinuation of oral contraceptives.  Ovarian cancer screening is an option for women who chose not to have a RRSO or who, as of yet, have not completed their family. Current screening methods for ovarian cancer are neither sensitive nor specific, meaning that often early stage ovarian cancer cannot be diagnosed through this screening.  Screening can also be falsely positive with no cancer present. For this reason, RRSO is recommended over screening. If ovarian cancer screening is recommended by your physician, it could include: CA-125 blood tests Transvaginal ultrasounds Clinical pelvic exams Surgical risk reduction:  Risk reducing salpingo-oophorectomy (RRSO, surgery to remove ovaries and fallopian tubes) is recommended beginning at age 23-45 or once childbearing is complete. Recommend that surgery is completed with a provider familiar in screening for cancers, such as a gynecologic oncologist. Removal of fallopian tubes with delayed removal of ovaries may be considered as well This surgery can reduce the risk of ovarian cancer by approximately 80% and can further reduce breast cancer risk in women who have not yet gone through menopause. There is still a small risk of  developing an ovarian-like cancer in the lining of the abdomen, called the peritoneum. Some studies have found a link between BRCA mutations and serous uterine cancer. We do not have enough information to make a recommendation about whether the uterus should be removed along with the ovaries and fallopian tubes. Women should talk about whether to remove the uterus (hysterectomy) with the doctor who will do the surgery. Hormone replacement therapy could be considered based on the physician's discretion. Consider referral to fertility specialist to discuss age related fertility considerations  Prostate Cancer Screening: Annual digital rectal exam (DRE) at age 2 Annual PSA blood test at age 62  Pancreatic Cancer Screening/Risk Reduction: Avoid smoking, heavy alcohol use, and obesity. Recommended for BRCA2 pathogenic and likely pathogenic variants. Begin at age 31 (or if there is a close family member with pancreatic cancer, 10 years before the  age at which they were diagnosed) and repeat yearly.  Ideally, screening should be performed in experienced centers utilizing a multidisciplinary approach under research conditions. Recommended screening could include annual endoscopic ultrasound (preferred) and/or MRI of the pancreas. CA19-9 testing in blood may be considered based on the physician's discretion.  Skin Cancer Screening and Risk Reduction: Regular skin self-examinations Individuals should notify their physicians of any changes to moles such as increasing in size, darkening in color, or other change in appearance. Annual skin examinations by a dermatologist  Follow sun-safety recommendations such as: Using UVA and UVB 30 SPF or higher sunscreen Avoiding sunburns Limiting sun exposure, especially during the hours of 11am-4pm  Wearing protective clothing and sunglasses Avoid using tanning beds  Additional Considerations: Recent studies have suggested PARP inhibitors may be a beneficial  chemotherapeutic agent for a subset of patients with BRCA2-associated breast, ovarian, prostate, and pancreatic cancers. Clinical trials are currently in process to determine if and how these agents can be useful in the treatment of BRCA2 cancer patients.  This information is based on current understanding of the gene and may change in the future.    IMPLICATIONS FOR FAMILY MEMBERS: Individuals with BRCA2 are encouraged to share information about their genetic test results with family members. A family letter will be provided to help share this result with relatives. "Cascade screening" is a systematic process through which additional family members with a BRCA2 mutation are identified, starting with all first degree relatives over the age of 82 (parents, siblings, and children) of people who have a BRCA2 mutation.   Hereditary predisposition to cancer due to pathogenic variants in the BRCA2 gene has autosomal dominant inheritance. This means that an individual with a pathogenic variant has a 50% chance of passing the condition on to their children. Identification of a pathogenic variant allows for the recognition of at-risk relatives who can pursue testing for the familial variant.  Family members are encouraged to consider genetic testing for this familial pathogenic variant. As there are generally no childhood cancer risks associated with pathogenic variants in the BRCA2 gene, individuals in the family are not typically recommended to have testing until they reach at least 58 years of age. They may contact our office at [phone] for more information or to schedule an appointment. Family members who live outside of the area are encouraged to find a genetic counselor in their area by visiting: [email or web address].  Additionally, for any family members who are of childbearing age, identification of a BRCA2 mutation may have reproductive implications. In rare cases a child may  inherit a BRCA2 mutation from each parent and have a condition called Fanconi Anemia (FA). FA is a childhood onset condition that can be associated with developmental differences, childhood cancer and bone marrow failure, among other health concerns. Therefore, if anyone of childbearing age is found to have the familial BRCA2 mutation, testing of their reproductive partner should be considered to clarify the chance to have a child with FA.    For individuals with an identified gene mutation, reproductive options are available including testing on-going pregnancies, testing children when they are of age, or IVF with the option to test embryos for mutations already found in the family. These options may be explored in greater detail with a reproductive or prenatal genetic counselor for more details.  RESOURCES: Facing Our Risk of Cancer Empowered (FORCE): www.facingourrisk.org   Bright Pink: www.brightpink.org Nothing Pink: www.nothingpink.org  ICARE Inherited Cancer Registry: https://inheritedcancer.net/  ADDITIONAL TESTING: Shawna Mcbride meets NCCN  criteria for genetic testing for hereditary cancer to be recommended based on her family history. There are other genes associated with hereditary cancer that could be evaluated for with a larger cancer panel. This would be billed to insurance. Shawna Mcbride will notify us  if she is interested in additional testing.   Given the family history of heart attack in two second degree relatives in their 26s, Shawna Mcbride could consider an updated lipid panel with her cardiologist. There are other genes associated with dyslipidemia and heart disease that were not reported on with the [facility] research study.   PLAN:  Referral to Dr. Ebbie in the High Risk Breast Clinic at [facility] Surgery is recommended to further discuss options for breast cancer screening and prevention. Shawna Mcbride is interested in a referral to further discuss her options for screening  and prevention.   Referral to [facility] Gynecologic Oncology is recommended to further discuss options for gynecologic cancer prevention. Shawna Mcbride is interested in a referral.   Shawna Mcbride declined a referral to [facility] Gastroenterology to further discuss pancreatic cancer screening. She plans to discuss with her GI provider, Dr. Burnette.   She plans to discuss options for skin cancer screening with her primary care provider.   Results will be shared with Shawna Mcbride's primary care provider, Lorren Greig PARAS, NP,  for further management.   Genetic test results should be shared with relatives, who can consider undergoing genetic testing for the BRCA2 pathogenic variant.   Lastly, we encouraged Shawna Mcbride to remain in contact with [facility]'s genetics team so that we can continuously update the family history and inform her of any changes in our understanding of hereditary breast and ovarian cancer and any additional genetic testing that may be of benefit for this family.   Shawna Mcbride questions were answered to her satisfaction today. Our contact information was provided should additional questions or concerns arise. Thank you for the referral and allowing us  to share in the care of your patient.   Burnard Ogren, MS, [facility] Licensed, Retail [email or web address] phone: [phone]  60 minutes were spent on the date of the encounter in service to the patient including preparation, face-to-face consultation, documentation and care coordination.  The patient was seen alone.  ldeman-Englert was available for questions, if needed.   I connected with  Shawna Mcbride on 10/10/2023 at 1 pm EDT by telephone and verified that I am speaking with the correct person using two identifiers.   Patient location: home Provider location: [facility] office   __________________________________________________________________ For Office Staff:  Number of people involved in  session: 1 Was an Intern/ student involved with case: yes. GC Intern Burnard Moose participated in this visit under direct supervision.

## 2023-10-11 ENCOUNTER — Telehealth: Payer: Self-pay | Admitting: *Deleted

## 2023-10-11 NOTE — Telephone Encounter (Signed)
 LMOM for the patient to call the office back. Patient needs to be scheduled with Dr Rogelio on 9/24

## 2023-10-14 ENCOUNTER — Ambulatory Visit (INDEPENDENT_AMBULATORY_CARE_PROVIDER_SITE_OTHER)

## 2023-10-14 ENCOUNTER — Encounter (HOSPITAL_COMMUNITY): Payer: Self-pay | Admitting: Emergency Medicine

## 2023-10-14 ENCOUNTER — Emergency Department (HOSPITAL_COMMUNITY)
Admission: EM | Admit: 2023-10-14 | Discharge: 2023-10-14 | Disposition: A | Discharge status: Home / Self Care | Attending: Emergency Medicine | Admitting: Emergency Medicine

## 2023-10-14 ENCOUNTER — Other Ambulatory Visit: Payer: Self-pay

## 2023-10-14 VITALS — BP 132/95 | HR 95 | Temp 97.7°F | Resp 20 | Ht 67.0 in | Wt 112.8 lb

## 2023-10-14 DIAGNOSIS — L500 Allergic urticaria: Secondary | ICD-10-CM

## 2023-10-14 DIAGNOSIS — D508 Other iron deficiency anemias: Secondary | ICD-10-CM | POA: Diagnosis not present

## 2023-10-14 DIAGNOSIS — T454X5A Adverse effect of iron and its compounds, initial encounter: Secondary | ICD-10-CM | POA: Diagnosis not present

## 2023-10-14 DIAGNOSIS — T7840XA Allergy, unspecified, initial encounter: Secondary | ICD-10-CM | POA: Diagnosis present

## 2023-10-14 MED ORDER — ALBUTEROL SULFATE HFA 108 (90 BASE) MCG/ACT IN AERS: 2.0000 | INHALATION_SPRAY | Freq: Once | RESPIRATORY_TRACT | Status: DC | PRN

## 2023-10-14 MED ORDER — SODIUM CHLORIDE 0.9 % IV SOLN
250.0000 mg | Freq: Once | INTRAVENOUS | Status: AC
  Administered 2023-10-14: 250 mg via INTRAVENOUS
  Filled 2023-10-14: qty 20

## 2023-10-14 MED ORDER — METHYLPREDNISOLONE SODIUM SUCC 125 MG IJ SOLR
125.0000 mg | Freq: Once | INTRAMUSCULAR | Status: AC | PRN
  Administered 2023-10-14: 125 mg via INTRAVENOUS

## 2023-10-14 MED ORDER — FAMOTIDINE IN NACL 20-0.9 MG/50ML-% IV SOLN
20.0000 mg | Freq: Once | INTRAVENOUS | Status: AC | PRN
  Administered 2023-10-14: 20 mg via INTRAVENOUS

## 2023-10-14 MED ORDER — EPINEPHRINE 0.3 MG/0.3ML IJ SOAJ: 0.3000 mg | Freq: Once | INTRAMUSCULAR | Status: DC | PRN

## 2023-10-14 MED ORDER — DIPHENHYDRAMINE HCL 50 MG/ML IJ SOLN
50.0000 mg | Freq: Once | INTRAMUSCULAR | Status: AC | PRN
  Administered 2023-10-14: 50 mg via INTRAVENOUS

## 2023-10-14 MED ORDER — DIPHENHYDRAMINE HCL 25 MG PO CAPS
25.0000 mg | ORAL_CAPSULE | Freq: Once | ORAL | Status: AC
  Administered 2023-10-14: 25 mg via ORAL
  Filled 2023-10-14: qty 1

## 2023-10-14 MED ORDER — SODIUM CHLORIDE 0.9 % IV SOLN: Freq: Once | INTRAVENOUS | Status: AC | PRN

## 2023-10-14 MED ORDER — EPINEPHRINE 0.3 MG/0.3ML IJ SOAJ: 0.3000 mg | INTRAMUSCULAR | 0 refills | Status: AC | PRN

## 2023-10-14 MED ORDER — ACETAMINOPHEN 325 MG PO TABS
650.0000 mg | ORAL_TABLET | Freq: Four times a day (QID) | ORAL | Status: DC | PRN
  Administered 2023-10-14: 650 mg via ORAL
  Filled 2023-10-14: qty 2

## 2023-10-14 MED ORDER — PREDNISONE 10 MG PO TABS: 40.0000 mg | ORAL_TABLET | Freq: Every day | ORAL | 0 refills | Status: AC

## 2023-10-14 NOTE — Discharge Instructions (Signed)
 You were seen in the ER today for concerns of an allergic reaction. I suspect this was likely to some component of your iron  infusion today. Please reach out to your hematologist so that they are aware and do not repeat this infusion until you have spoken with them. I am sending you home with an EpiPen  and a short course of prednisone  to use over the next few days. If your symptoms worsen or return, please return to the ER for further evaluation and management.

## 2023-10-14 NOTE — ED Triage Notes (Signed)
 Patient BIB EMS from [facility] c/ allergic reaction to Iron  infusion. Patient had 1L NS , 50mg  IV Bendryl, 125 mg IV Solumedrol , Pepcid  20mg  IV from [facility]. Patient had 0.3 Epi-pen given by EMS after patient got hypotensive at the truck. Patient a/ox4 upon arrival.

## 2023-10-14 NOTE — Progress Notes (Signed)
 Diagnosis: Iron  Deficiency Anemia  Provider:  Praveen Mannam MD  Procedure: IV Infusion  IV Type: Peripheral, IV Location: L Antecubital  Ferrlecit (ferric gluconate), Dose: 250 mg  Infusion Start Time: 1354  Infusion Stop Time: 1610  Post Infusion IV Care: Observation period completed   Discharge: Condition: Good, Destination: ER . AVS Declined  Performed by:  Maximiano JONELLE Pouch, LPN   At 8389, patient complained of symptoms including swelling to bilateral hands. At approximately 1630 hives noted to bilateral arms and legs. Emergency protocols initiated and emergency medications administered including Benadryl  50 mg IVP at 1633, Solumedrol 125 mg IVP at 1638, NS 1000 mL bolus at 1632 (rate decreased to 250 mL/hour at 1640), and Pepcid  20 mg IVPB at 1636 . Vital signs stable. EMS called at 1647 and arrived at 88. Ordering provider Dr. Pauletta Chihuahua notified via secure message at 1633, and responded at 1634. Additional orders were not given. Family/signficant other present. Medical director Lonna Coder, MD notified via secure message at [phone].   Patient was transported to [facility] via EMS.

## 2023-10-14 NOTE — ED Provider Notes (Signed)
 [place] EMERGENCY DEPARTMENT AT [facility] Provider Note   CSN: [id number] Arrival date & time: 10/14/23  1753     Patient presents with: Allergic Reaction   Shawna Mcbride is a 58 y.o. female.  Patient past history significant for normocytic anemia presents the emergency department with concerns of an allergic reaction.  Patient reportedly was at the infusion center receiving an iron  infusion for her iron  deficiency anemia.  She states that she was almost entirely through her infusion and was in the postinfusion waiting period when she began to have reaction about 50 minutes during this period.  She states that she began to notice swelling, redness, and hives develop in her upper and lower extremities.  She did begin to feel some swelling in her throat but denies any difficulty breathing or swallowing.  She received Benadryl , Solu-Medrol , and Pepcid  at the infusion center and then 0.3 mg of epi by EMS and transportation due to concerns that she was hypotensive.  Unclear if there was concerns at that time for anaphylaxis and if she received the epinephrine  due to concerns for that or isolated hypotension as patient herself reports that she was not feeling symptomatic at the time that she received the epinephrine .   Allergic Reaction Presenting symptoms: rash        Prior to Admission medications   Medication Sig Start Date End Date Taking? Authorizing Provider  EPINEPHrine  0.3 mg/0.3 mL IJ SOAJ injection Inject 0.3 mg into the muscle as needed for anaphylaxis. 10/14/23  Yes Jama Mcmiller A, PA-C  predniSONE  (DELTASONE ) 10 MG tablet Take 4 tablets (40 mg total) by mouth daily for 4 days. 10/14/23 10/18/23 Yes Corderro Koloski A, PA-C  BLACK COHOSH PO Take 2 tablets by mouth daily at 12 noon.    [provider]  cholecalciferol (VITAMIN D3) 25 MCG (1000 UNIT) tablet Take 1,000 Units by mouth daily.    [provider]  escitalopram  (LEXAPRO ) 20 MG tablet Take 1 tablet  (20 mg total) by mouth daily. 09/09/23   Lorren Greig PARAS, NP  hydrOXYzine  (VISTARIL ) 50 MG capsule Take 1 capsule (50 mg total) by mouth every 8 (eight) hours as needed. 09/09/23   Lorren Greig PARAS, NP  Iron , Ferrous Sulfate , 325 (65 Fe) MG TABS Take 325 mg by mouth daily. 08/07/23   Lorren Greig PARAS, NP  Misc Natural Products (OSTEO BI-FLEX TRIPLE STRENGTH) TABS Take 0.5 tablets by mouth daily at 12 noon.    [provider]  Multiple Vitamins-Minerals (MULTIVITAMIN WOMEN 50+) TABS Take 1 tablet by mouth daily.    [provider]  pantoprazole  (PROTONIX ) 40 MG tablet Take 1 tablet (40 mg total) by mouth 2 (two) times daily before a meal. Patient taking differently: Take 40 mg by mouth daily. 07/23/23 09/06/23  Elgergawy, Brayton RAMAN, MD  vitamin E 180 MG (400 UNITS) capsule Take 400 Units by mouth daily at 12 noon.    [provider]    Allergies: Nsaids and Sulfa antibiotics    Review of Systems  Skin:  Positive for rash.  All other systems reviewed and are negative.   Updated Vital Signs BP 137/83   Pulse 64   Temp 98 F (36.7 C)   Resp 18   SpO2 100%   Physical Exam Vitals and nursing note reviewed.  Constitutional:      General: She is not in acute distress.    Appearance: Normal appearance. She is well-developed. She is not ill-appearing.  HENT:  Head: Normocephalic and atraumatic.  Eyes:     Conjunctiva/sclera: Conjunctivae normal.  Cardiovascular:     Rate and Rhythm: Normal rate and regular rhythm.     Heart sounds: No murmur heard. Pulmonary:     Effort: Pulmonary effort is normal. No respiratory distress.     Breath sounds: Normal breath sounds. No wheezing, rhonchi or rales.  Abdominal:     General: Abdomen is flat. Bowel sounds are normal. There is no distension.     Palpations: Abdomen is soft.     Tenderness: There is no abdominal tenderness. There is no guarding.  Musculoskeletal:        General: No swelling.     Cervical back: Neck supple.   Skin:    General: Skin is warm and dry.     Capillary Refill: Capillary refill takes less than 2 seconds.     Findings: No erythema, lesion or rash.  Neurological:     Mental Status: She is alert.  Psychiatric:        Mood and Affect: Mood normal.     (all labs ordered are listed, but only abnormal results are displayed) Labs Reviewed - No data to display  EKG: None  Radiology: No results found.   Procedures   Medications Ordered in the ED - No data to display                                  Medical Decision Making Risk Prescription drug management.   This patient presents to the ED for concern of allergic reaction.  Differential diagnosis includes anaphylaxis, urticaria, medication reaction   Problem List / ED Course:  Patient presents to the emergency department with concerns of allergic reaction.  Reportedly had a reaction while she was receiving an iron  infusion.  No history of iron  infusions as the first one and only time that she has not had any sort of allergic reaction.  No history of anaphylaxis to any source.  She reportedly received Benadryl , Solu-Medrol , Pepcid  prior to arriving at the infusion center.  Received epinephrine  by EMS due to concerns of hypotension. On exam, patient is well-appearing.  No hives, swelling, itching, wheezing, rales, rhonchi, or difficulty swallowing.  No oropharyngeal erythema or angioedema present. At this time, patient does not appear to be in any sort of acute reaction.  It does appear that she likely was having reaction possible or questionable anaphylaxis.  She is approximately 4 hours post-reaction. She is requesting to be discharged home at this time. With no current signs of any reaction present and known trigger, I feel that she is stable for discharge. Will send home with short course of prednisone  as well as an Epi-Pen. Return precautions discussed. Discharged home in stable condition.   Social Determinants of  Health:  None  Final diagnoses:  Allergic reaction, initial encounter    ED Discharge Orders          Ordered    predniSONE  (DELTASONE ) 10 MG tablet  Daily        10/14/23 1957    EPINEPHrine  0.3 mg/0.3 mL IJ SOAJ injection  As needed        10/14/23 1957               Maddyx Wieck A, PA-C 10/14/23 2322    Freddi Hamilton, MD 10/17/23 [phone]

## 2023-10-15 ENCOUNTER — Telehealth: Payer: Self-pay

## 2023-10-15 NOTE — Telephone Encounter (Signed)
 Spoke with Shawna Mcbride regarding her referral to GYN oncology. She has an appointment scheduled with Dr. Rogelio on 10/29 at 2:00. Patient agrees to date and time. She has been provided with office address and location. She is also aware of our mask and visitor policy. Patient verbalized understanding and will call with any questions.    Shawna Mcbride declined earlier appointment date/times d/t her job, reporting she travels and will be out of town on offered dates.

## 2023-10-15 NOTE — Telephone Encounter (Signed)
 LMOM for the patient to call the office back. Patient needs to be scheduled with Dr Rogelio on 9/24

## 2023-10-15 NOTE — Telephone Encounter (Signed)
 Chart reviewed, patient was discharged home from ED yesterday after having reaction from Ferrlecit.   Called patient but no answer.  Inquire if she is feeling better from allergic reaction due to Ferrlecit yesterday.  Left message.  Advised to call if any questions.  Please call patient tomorrow to see how she is doing.  Please schedule for lab appointment about 2-3 months from now and follow-up with me few days after blood draw.  Thank you.

## 2023-10-21 ENCOUNTER — Ambulatory Visit

## 2023-11-05 ENCOUNTER — Ambulatory Visit

## 2023-11-27 ENCOUNTER — Encounter: Payer: Self-pay | Admitting: Family

## 2023-11-27 ENCOUNTER — Telehealth: Payer: Self-pay | Admitting: *Deleted

## 2023-11-27 ENCOUNTER — Other Ambulatory Visit: Payer: Self-pay | Admitting: Family

## 2023-11-27 DIAGNOSIS — Z716 Tobacco abuse counseling: Secondary | ICD-10-CM

## 2023-11-27 MED ORDER — NICOTINE 7 MG/24HR TD PT24: 7.0000 mg | MEDICATED_PATCH | Freq: Every day | TRANSDERMAL | 2 refills | Status: DC

## 2023-11-27 MED ORDER — NICOTINE POLACRILEX 2 MG MT GUM: CHEWING_GUM | OROMUCOSAL | 2 refills | Status: DC

## 2023-11-27 NOTE — Telephone Encounter (Signed)
 Communication  Reason for CRM: Patient states she has been going back and forth with wanting to stop smoking. This has been discussed with her provider before, states she was told just to let her know when and she would be happy to send something in. Please reach out to discuss any options patient may have for medications # [phone]        [id number] - [place] [employer], [place] - [address]

## 2023-11-27 NOTE — Telephone Encounter (Signed)
 Nicotine  patch and Nicotine  Polacrilex prescribed. Follow-up in 4 weeks.

## 2023-11-28 ENCOUNTER — Telehealth: Payer: Self-pay | Admitting: Emergency Medicine

## 2023-11-28 ENCOUNTER — Other Ambulatory Visit: Payer: Self-pay

## 2023-11-28 ENCOUNTER — Encounter: Payer: Self-pay | Admitting: Plastic Surgery

## 2023-11-28 ENCOUNTER — Ambulatory Visit: Admitting: Plastic Surgery

## 2023-11-28 VITALS — BP 133/89 | HR 101 | Ht 67.0 in | Wt 116.0 lb

## 2023-11-28 DIAGNOSIS — Z720 Tobacco use: Secondary | ICD-10-CM | POA: Diagnosis not present

## 2023-11-28 DIAGNOSIS — Z803 Family history of malignant neoplasm of breast: Secondary | ICD-10-CM | POA: Diagnosis not present

## 2023-11-28 DIAGNOSIS — D509 Iron deficiency anemia, unspecified: Secondary | ICD-10-CM

## 2023-11-28 DIAGNOSIS — Z1502 Genetic susceptibility to malignant neoplasm of ovary: Secondary | ICD-10-CM

## 2023-11-28 DIAGNOSIS — Z1501 Genetic susceptibility to malignant neoplasm of breast: Secondary | ICD-10-CM | POA: Diagnosis not present

## 2023-11-28 NOTE — Telephone Encounter (Signed)
 Copied from CRM [id number]. Topic: Clinical - Medication Question >> Nov 28, 2023 11:13 AM Tiffini S wrote: Reason for CRM: Patient states wants to stop smoking. Needs a pill medication/ prescription still to the pharmacy- do not want to use the gum (cannot chew) or the patches. Need medication that will do more for habit, mental states and addiction for smoking.      Can still reach out to the patient to discuss any options patient may have for medications # [phone] if needed    [id number] - [place], [place] - [address]

## 2023-11-28 NOTE — Progress Notes (Signed)
 Referring Provider Lorren Greig PARAS, NP [address] [place],  [place] [address]   CC:  Chief Complaint  Patient presents with   consult      Shawna Mcbride is an 58 y.o. female.  HPI: Shawna Mcbride is a 58 year old female who is referred from Dr. Ebbie for discussion of bilateral breast reconstruction after mastectomy for risk reduction.  Patient was found to be BRCA positive and is requesting bilateral mastectomies.  She denies any prior breast issues.  Patient does smoke but denies any history of DVT.  She has a history of anemia after a GI bleed.  She is undergoing iron  replacement therapy for this.  Patient has talked to her primary care provider regarding smoking cessation options and has started to cut back.  Her goal is to be completely smoke-free by the time she undergoes surgery.  Allergies  Allergen Reactions   Nsaids Other (See Comments)    Ulcers, takes goody or BC   Sulfa Antibiotics Hives   Ferrlecit [Na Ferric Gluc Cplx In Sucrose] Hives and Swelling    Outpatient Encounter Medications as of 11/28/2023  Medication Sig   EPINEPHrine  0.3 mg/0.3 mL IJ SOAJ injection Inject 0.3 mg into the muscle as needed for anaphylaxis.   pantoprazole  (PROTONIX ) 40 MG tablet Take 1 tablet (40 mg total) by mouth 2 (two) times daily before a meal.   vitamin E 180 MG (400 UNITS) capsule Take 400 Units by mouth daily at 12 noon.   BLACK COHOSH PO Take 2 tablets by mouth daily at 12 noon.   cholecalciferol (VITAMIN D3) 25 MCG (1000 UNIT) tablet Take 1,000 Units by mouth daily.   escitalopram  (LEXAPRO ) 20 MG tablet Take 1 tablet (20 mg total) by mouth daily.   hydrOXYzine  (VISTARIL ) 50 MG capsule Take 1 capsule (50 mg total) by mouth every 8 (eight) hours as needed.   Iron , Ferrous Sulfate , 325 (65 Fe) MG TABS Take 325 mg by mouth daily.   Misc Natural Products (OSTEO BI-FLEX TRIPLE STRENGTH) TABS Take 0.5 tablets by mouth daily at 12 noon.   Multiple Vitamins-Minerals  (MULTIVITAMIN WOMEN 50+) TABS Take 1 tablet by mouth daily.   [DISCONTINUED] nicotine  (NICODERM CQ  - DOSED IN MG/24 HR) 7 mg/24hr patch Place 1 patch (7 mg total) onto the skin daily.   [DISCONTINUED] nicotine  polacrilex (NICORETTE) 2 MG gum Chew 1 piece of gum every 1 to 2 hours (maximum: 24 pieces/day).   No facility-administered encounter medications on file as of 11/28/2023.     Past Medical History:  Diagnosis Date   Arthritis     Past Surgical History:  Procedure Laterality Date   ABDOMINAL HYSTERECTOMY     ESOPHAGOGASTRODUODENOSCOPY  01/02/2011   Procedure: ESOPHAGOGASTRODUODENOSCOPY (EGD);  Surgeon: Lamar LULLA Bunk, MD;  Location: [facility] ENDOSCOPY;  Service: Endoscopy;  Laterality: N/A;  ok to do in endo unit, regular adult endoscope   ESOPHAGOGASTRODUODENOSCOPY Left 07/23/2023   Procedure: EGD (ESOPHAGOGASTRODUODENOSCOPY);  Surgeon: Burnette Fallow, MD;  Location: [facility] ENDOSCOPY;  Service: Gastroenterology;  Laterality: Left;    Family History  Problem Relation Age of Onset   Breast cancer Mother 72   Heart attack Maternal Grandfather 64 - 49   Breast cancer Maternal Grandmother 95 - 89   Stroke Half-Sister 10   Ovarian cancer Maternal Aunt 48 - 69   Cancer - Other Maternal Aunt        blood and/or bone cancer? treated with transfusion or transplant, not leukemia   Heart attack  Maternal Uncle    Lung cancer Maternal Cousin 38       metastatic to brain   Anesthesia problems Neg Hx    Hypotension Neg Hx    Malignant hyperthermia Neg Hx    Pseudochol deficiency Neg Hx     Social History   Social History Narrative   Not on file     Review of Systems General: Denies fevers, chills, weight loss CV: Denies chest pain, shortness of breath, palpitations Breast: Patient has no specific breast complaints.  She was found to be BRCA positive.  She notes that she has asymmetric and is interested in reconstruction to the size of the smaller breast.  Physical Exam    11/28/2023     1:39 PM 10/14/2023    6:58 PM 10/14/2023    6:11 PM  Vitals with BMI  Height 5' 7    Weight 116 lbs    BMI 18.16    Systolic 133 137 861  Diastolic 89 83 83  Pulse 101 64 89    General:  No acute distress,  Alert and oriented, Non-Toxic, Normal speech and affect Breast: As noted patient with marked asymmetry with the left breast larger than the right.  She has grade 2 ptosis bilaterally.  She has an 8 cm base width on the right  Abdomen: No lower midline incisions or low transverse incisions.  Patient has a paucity of excess tissue. Mammogram: No mammograms since 2015.  She is currently scheduled to undergo a contrast-enhanced mammogram.  I have asked her to provide us  with a copy of this once it is complete. Assessment/Plan BRCA positive, seeking surgical risk reduction: Patient is currently discussing options for surgical risk reduction with Dr. Ebbie.  She will most likely undergo bilateral mastectomies.  She is not planning on nipple sparing mastectomies.  I discussed several options with her.  We reviewed tissue-based reconstruction.  I showed her the location of the tissue removed for a DIEP flap and how this is generally shaped for breast reconstruction.  We discussed the need for hospitalization for 2 to 3 days after the procedure and the fact that it is not done at the same time the mastectomies are.  We then discussed implant-based reconstruction.  I gave her the opportunity to see a tissue expander and a silicone implant.  We discussed how the tissue expanders were placed at the time of surgery, the expansion process, and the replacement of the tissue expander with a silicone implant.  We discussed the risks of bleeding and infection.  She understands that infection is the greatest risk and that if she has infection the tissue expander or implant will have to be removed and we would need to restart the reconstructive process.  We did discuss BIA-ALCL and how this has been associated  with textured but not smooth silicone implants.  We discussed the fact that she will need surveillance of her implants after they are placed.  This is generally done with MRI.  We discussed the possibility of nipple tattooing after mastectomy with implant-based reconstruction.  We discussed the fact that the implants may need to be replaced at some point in the future.  We discussed the fact that there will be some asymmetry since she is already so asymmetric.  I will try to limit this as much as possible.  She may require additional procedures after placement of the implants.  All questions were answered to her satisfaction.  Photographs were obtained today with her consent.  Will submit her for placement of bilateral tissue expanders at the time of her mastectomy.  She is encouraged to call or use MyChart or make a follow-up appointment if she has any further questions or concerns.  Regarding her smoking she is diligently working on smoking cessation.  She does understand that if she is still using tobacco that this increases her risk of wound healing complications and may complicate her reconstruction.  [name] 11/28/2023, 3:20 PM

## 2023-11-28 NOTE — Telephone Encounter (Signed)
 Patient is appreciative of the patch and gum but is not fond of taking this.   She is requesting Chantix or Zyban. Something in a pill form to help with smoking cessation.   She states she is only depressed due to needing to have a double mastectomy and not really depressed about it because it is what is needed. She states she is not suicidal.

## 2023-11-29 ENCOUNTER — Other Ambulatory Visit: Payer: Self-pay | Admitting: Family

## 2023-11-29 DIAGNOSIS — Z716 Tobacco abuse counseling: Secondary | ICD-10-CM

## 2023-11-29 MED ORDER — VARENICLINE TARTRATE 0.5 MG PO TABS: 0.5000 mg | ORAL_TABLET | Freq: Every day | ORAL | 0 refills | Status: DC

## 2023-11-29 NOTE — Telephone Encounter (Signed)
 Varenicline prescribed (11/29/2023  8:48 AM EDT).

## 2023-11-29 NOTE — Telephone Encounter (Signed)
 Varenicline prescribed.

## 2023-12-02 NOTE — Telephone Encounter (Signed)
 I called patient to make her aware prescription was seen in and she has already picked it up and stated it is already helping with cravings

## 2023-12-03 ENCOUNTER — Other Ambulatory Visit: Payer: Self-pay | Admitting: Gynecologic Oncology

## 2023-12-03 ENCOUNTER — Telehealth: Payer: Self-pay | Admitting: Emergency Medicine

## 2023-12-03 DIAGNOSIS — Z1501 Genetic susceptibility to malignant neoplasm of breast: Secondary | ICD-10-CM

## 2023-12-03 NOTE — Telephone Encounter (Signed)
 Copied from CRM [id number]. Topic: Clinical - Medication Question >> Nov 28, 2023  4:11 PM Winona R wrote: Pt calling to inform the MA or Nurse who called her that she is ok with starting Wellbutrin if that's what's best. Pt is under the impression this will aide in her attempt to stop smoking I called patient and patient stated this message is old she is ok

## 2023-12-04 ENCOUNTER — Inpatient Hospital Stay: Admitting: Obstetrics & Gynecology

## 2023-12-04 ENCOUNTER — Inpatient Hospital Stay

## 2023-12-04 ENCOUNTER — Ambulatory Visit: Payer: Self-pay

## 2023-12-04 VITALS — BP 122/88 | HR 73 | Temp 98.1°F | Resp 18 | Ht 67.0 in | Wt 118.2 lb

## 2023-12-04 DIAGNOSIS — Z1505 Genetic susceptibility to malignant neoplasm of fallopian tube(s): Secondary | ICD-10-CM | POA: Insufficient documentation

## 2023-12-04 DIAGNOSIS — Z148 Genetic carrier of other disease: Secondary | ICD-10-CM | POA: Diagnosis not present

## 2023-12-04 DIAGNOSIS — Z1507 Genetic susceptibility to malignant neoplasm of urinary tract: Secondary | ICD-10-CM | POA: Insufficient documentation

## 2023-12-04 DIAGNOSIS — Z1501 Genetic susceptibility to malignant neoplasm of breast: Secondary | ICD-10-CM | POA: Diagnosis not present

## 2023-12-04 DIAGNOSIS — D649 Anemia, unspecified: Secondary | ICD-10-CM | POA: Diagnosis not present

## 2023-12-04 DIAGNOSIS — Z1509 Genetic susceptibility to other malignant neoplasm: Secondary | ICD-10-CM | POA: Insufficient documentation

## 2023-12-04 DIAGNOSIS — Z90710 Acquired absence of both cervix and uterus: Secondary | ICD-10-CM | POA: Insufficient documentation

## 2023-12-04 DIAGNOSIS — D509 Iron deficiency anemia, unspecified: Secondary | ICD-10-CM

## 2023-12-04 DIAGNOSIS — Z1502 Genetic susceptibility to malignant neoplasm of ovary: Secondary | ICD-10-CM | POA: Diagnosis not present

## 2023-12-04 LAB — CBC WITH DIFFERENTIAL (CANCER CENTER ONLY)
Abs Immature Granulocytes: 0.01 K/uL (ref 0.00–0.07)
Basophils Absolute: 0.1 K/uL (ref 0.0–0.1)
Basophils Relative: 1 %
Eosinophils Absolute: 0.3 K/uL (ref 0.0–0.5)
Eosinophils Relative: 4 %
HCT: 45.3 % (ref 36.0–46.0)
Hemoglobin: 14.8 g/dL (ref 12.0–15.0)
Immature Granulocytes: 0 %
Lymphocytes Relative: 20 %
Lymphs Abs: 1.2 K/uL (ref 0.7–4.0)
MCH: 29.1 pg (ref 26.0–34.0)
MCHC: 32.7 g/dL (ref 30.0–36.0)
MCV: 89 fL (ref 80.0–100.0)
Monocytes Absolute: 0.6 K/uL (ref 0.1–1.0)
Monocytes Relative: 9 %
Neutro Abs: 4 K/uL (ref 1.7–7.7)
Neutrophils Relative %: 66 %
Platelet Count: 277 K/uL (ref 150–400)
RBC: 5.09 MIL/uL (ref 3.87–5.11)
RDW: 15.3 % (ref 11.5–15.5)
WBC Count: 6.1 K/uL (ref 4.0–10.5)
nRBC: 0 % (ref 0.0–0.2)

## 2023-12-04 LAB — IRON AND IRON BINDING CAPACITY (CC-WL,HP ONLY)
Iron: 114 ug/dL (ref 28–170)
Saturation Ratios: 29 % (ref 10.4–31.8)
TIBC: 396 ug/dL (ref 250–450)
UIBC: 282 ug/dL (ref 148–442)

## 2023-12-04 LAB — FERRITIN: Ferritin: 94 ng/mL (ref 11–307)

## 2023-12-04 NOTE — Patient Instructions (Signed)
  VISIT SUMMARY: Today, we discussed your risk-reducing surgery options for ovarian cancer due to your BRCA2 mutation. We also reviewed your family history of cancer and your progress in reducing smoking and vaping. You are currently focused on breast surgery and reconstruction.  YOUR PLAN: -GENETIC SUSCEPTIBILITY TO MALIGNANT NEOPLASM OF BREAST (BRCA2 MUTATION) AND RISK OF OVARIAN CANCER: Your BRCA2 mutation increases your risk for breast and ovarian cancer. We discussed the option of a bilateral salpingo-oophorectomy, which is a surgery to remove both ovaries and fallopian tubes to reduce the risk of ovarian cancer. This surgery will be done laparoscopically, meaning with small incisions, and you can go home the same day if there are no complications. There is still a very small risk of primary peritoneal cancer after the surgery. We will also do a baseline pelvic ultrasound to check your ovaries and schedule a preoperative visit after your breast surgery is completed.  -HISTORY OF HYSTERECTOMY (LAPAROSCOPIC-ASSISTED VAGINAL): You have previously had a laparoscopic-assisted vaginal hysterectomy, which you recovered from well.  -GENERAL HEALTH MAINTENANCE: You have stopped drinking and are making great progress in reducing smoking and vaping with the help of medication. Keep up the good work and continue your efforts to quit smoking and vaping completely.  INSTRUCTIONS: We will order a baseline pelvic ultrasound to assess your ovaries. After your breast surgery is completed, we will schedule a preoperative visit for your ovarian risk-reducing surgery, which can be done virtually or by phone.                      Contains text generated by [employer]bridge.                                 Contains text generated by [employer]bridge.

## 2023-12-04 NOTE — Progress Notes (Unsigned)
 Follow Up Note: Gyn-Onc  Shawna Mcbride 58 y.o. female  CC: BRCA 2 mutation carrier   HPI: Shawna Mcbride is a 58 y.o. female with no personal history of cancer and recent genetic testing that identified a pathogenic variant in the BRCA2 gene. She is s/p TLH or LAVH in the late 1990s w/ovary conservation. She presents to discuss rr surgery. H/O chronic anemia--hgb in 8/25 was 11.2-- in the setting of chronic alcohol abuse, recent upper GI bleed. She no longer consumes alcohol or tobacco.  Review of Systems  Review of Systems  Constitutional:  Negative for malaise/fatigue and weight loss.  Respiratory:  Negative for shortness of breath and wheezing.   Cardiovascular:  Negative for chest pain and leg swelling.  Gastrointestinal:  Negative for abdominal pain, blood in stool, constipation, nausea and vomiting.  Genitourinary:  Negative for dysuria, frequency, hematuria and urgency.  Musculoskeletal:  Negative for joint pain and myalgias.  Neurological:  Negative for weakness.  Psychiatric/Behavioral:  Negative for depression. The patient does not have insomnia.    Current medications, allergy, social history, past surgical history, past medical history, family history were all reviewed.    Vitals:  BP 122/88 (BP Location: Left Arm, Patient Position: Sitting)   Pulse 73   Temp 98.1 F (36.7 C) (Oral)   Resp 18   Ht 5' 7 (1.702 m)   Wt 118 lb 3.2 oz (53.6 kg)   SpO2 99%   BMI 18.51 kg/m    Physical Exam:  Physical Exam Exam conducted with a chaperone present.  Constitutional:      General: She is not in acute distress. Appears older than stated age Cardiovascular:     Rate and Rhythm: Normal rate and regular rhythm.  Pulmonary:     Effort: Pulmonary effort is normal.     Breath sounds: Normal breath sounds. No wheezing or rhonchi.  Abdominal:     Palpations: Abdomen is soft.     Tenderness: There is no abdominal tenderness. There is no right CVA tenderness or left CVA tenderness.      Hernia: No hernia is present.  Genitourinary:    General: Normal vulva.     Urethra: No urethral lesion.     Vagina: No lesions. No bleeding Musculoskeletal:     Cervical back: Neck supple.     Right lower leg: No edema.     Left lower leg: No edema.  Lymphadenopathy:     Upper Body:     Right upper body: No supraclavicular adenopathy.     Left upper body: No supraclavicular adenopathy.     Lower Body: No right inguinal adenopathy. No left inguinal adenopathy.  Skin:    Findings: No rash.  Neurological:     Mental Status: She is oriented to person, place, and time.     Assessment & Plan Genetic susceptibility to malignant neoplasm of breast (BRCA2 mutation) and risk of ovarian cancer BRCA2 mutation increases risk for breast and ovarian cancer. She prioritizes breast surgery and reconstruction before ovarian risk-reducing surgery. - Discussed risk-reducing surgery options, including bilateral salpingo-oophorectomy to reduce ovarian cancer risk. - Surgery will be performed robotic-assistance through small incisions, allowing for same-day discharge barring complications. - Informed about the residual risk of primary peritoneal cancer post-surgery, which is rare. - Provide information about bilateral salpingo-oophorectomy in after visit summary. - Order baseline pelvic ultrasound to assess ovaries. - Review the baseline CA 125 result - Schedule preoperative visit virtually or by phone after breast surgery is completed.  History of hysterectomy (laparoscopic-assisted vaginal) Previous possible laparoscopic-assisted vaginal hysterectomy with good recovery.  General Health Maintenance She has stopped drinking and is reducing smoking and vaping with medication assistance. - Encouraged continued cessation efforts.  I personally spent 30 minutes face-to-face and non-face-to-face in the care of this patient, which includes all pre, intra, and post visit time on the date of service.    [place], MD

## 2023-12-05 ENCOUNTER — Ambulatory Visit: Payer: Self-pay | Admitting: Gynecologic Oncology

## 2023-12-05 ENCOUNTER — Encounter: Payer: Self-pay | Admitting: Obstetrics & Gynecology

## 2023-12-05 ENCOUNTER — Other Ambulatory Visit: Payer: Self-pay | Admitting: Family

## 2023-12-05 DIAGNOSIS — F32A Depression, unspecified: Secondary | ICD-10-CM

## 2023-12-05 LAB — CA 125: Cancer Antigen (CA) 125: 16.6 U/mL (ref 0.0–38.1)

## 2023-12-06 NOTE — Telephone Encounter (Signed)
 Complete

## 2023-12-10 ENCOUNTER — Ambulatory Visit (HOSPITAL_COMMUNITY)

## 2023-12-24 ENCOUNTER — Encounter (INDEPENDENT_AMBULATORY_CARE_PROVIDER_SITE_OTHER): Payer: Self-pay

## 2023-12-31 ENCOUNTER — Other Ambulatory Visit: Payer: Self-pay | Admitting: General Surgery

## 2024-01-09 ENCOUNTER — Encounter: Payer: Self-pay | Admitting: Plastic Surgery

## 2024-01-29 ENCOUNTER — Ambulatory Visit: Admitting: Plastic Surgery

## 2024-01-29 ENCOUNTER — Encounter (INDEPENDENT_AMBULATORY_CARE_PROVIDER_SITE_OTHER): Payer: Self-pay

## 2024-01-29 ENCOUNTER — Other Ambulatory Visit (HOSPITAL_COMMUNITY): Payer: Self-pay

## 2024-01-29 VITALS — BP 146/91 | HR 78

## 2024-01-29 DIAGNOSIS — Z1501 Genetic susceptibility to malignant neoplasm of breast: Secondary | ICD-10-CM | POA: Diagnosis not present

## 2024-01-29 DIAGNOSIS — Z803 Family history of malignant neoplasm of breast: Secondary | ICD-10-CM

## 2024-01-29 DIAGNOSIS — Z1507 Genetic susceptibility to malignant neoplasm of urinary tract: Secondary | ICD-10-CM

## 2024-01-29 MED ORDER — ONDANSETRON 4 MG PO TBDP: 4.0000 mg | ORAL_TABLET | Freq: Once | ORAL | Status: DC

## 2024-01-29 MED ORDER — OXYCODONE-ACETAMINOPHEN 10-325 MG PO TABS
1.0000 | ORAL_TABLET | Freq: Three times a day (TID) | ORAL | 0 refills | Status: AC | PRN
  Filled 2024-01-29: qty 15, 5d supply, fill #0

## 2024-01-29 NOTE — H&P (View-Only) (Signed)
 "    Patient ID: Shawna Mcbride, female    DOB: 08-02-1965, 58 y.o.   MRN: [id number]  No chief complaint on file.   No diagnosis found.   History of Present Illness: Shawna Mcbride is a 58 y.o.  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  She presents for preoperative evaluation for upcoming procedure, placement of bilateral tissue expanders after mastectomy, scheduled for January 14 with Dr. Waddell.  The patient has not had problems with anesthesia.   Summary of Previous Visit: Shawna Mcbride is a 58 year old female who is referred from Dr. Ebbie for discussion of bilateral breast reconstruction after mastectomy for risk reduction. Patient was found to be BRCA positive and is requesting bilateral mastectomies. She denies any prior breast issues. Patient does smoke but denies any history of DVT. She has a history of anemia after a GI bleed. She is undergoing iron  replacement therapy for this. Patient has talked to her primary care provider regarding smoking cessation options and has started to cut back. Her goal is to be completely smoke-free by the time she undergoes surgery.   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  Last hemoglobin on October 29 was 14.8   Past Medical History: Allergies: NSAIDs and sulfa medications  Current Medications: Current Medications[1]  Past Medical Problems: Past Medical History:  Diagnosis Date   Arthritis     Past Surgical History: Past Surgical History:  Procedure Laterality Date   ABDOMINAL HYSTERECTOMY     ESOPHAGOGASTRODUODENOSCOPY  01/02/2011   Procedure: ESOPHAGOGASTRODUODENOSCOPY (EGD);  Surgeon: Lamar LULLA Bunk, MD;  Location: [facility] ENDOSCOPY;  Service: Endoscopy;  Laterality: N/A;  ok to do in endo unit, regular adult endoscope   ESOPHAGOGASTRODUODENOSCOPY Left 07/23/2023   Procedure: EGD (ESOPHAGOGASTRODUODENOSCOPY);  Surgeon: Burnette Fallow, MD;  Location: [facility] ENDOSCOPY;   Service: Gastroenterology;  Laterality: Left;    Social History: Social History   Socioeconomic History   Marital status: Legally Separated    Spouse name: Not on file   Number of children: Not on file   Years of education: Not on file   Highest education level: Not on file  Occupational History   Not on file  Tobacco Use   Smoking status: Every Day    Current packs/day: 0.50    Types: Cigarettes, E-cigarettes   Smokeless tobacco: Never  Vaping Use   Vaping status: Some Days   Substances: Nicotine   Substance and Sexual Activity   Alcohol use: Yes    Comment: at least 4 beers per day   Drug use: Yes    Types: Marijuana   Sexual activity: Yes  Other Topics Concern   Not on file  Social History Narrative   Not on file   Social Drivers of Health   Tobacco Use: High Risk (12/31/2023)   Received from [facility]   Patient History    Smoking Tobacco Use: Every Day    Smokeless Tobacco Use: Never    Passive Exposure: Not on file  Financial Resource Strain: Low Risk (08/06/2023)   Overall Financial Resource Strain (CARDIA)    Difficulty of Paying Living Expenses: Not hard at all  Food Insecurity: No Food Insecurity (09/06/2023)   Epic    Worried About Radiation Protection Practitioner of Food in the Last Year: Never true    Ran Out of Food in the Last Year: Never true  Transportation Needs: No Transportation Needs (09/06/2023)  Epic    Lack of Transportation (Medical): No    Lack of Transportation (Non-Medical): No  Physical Activity: Insufficiently Active (08/06/2023)   Exercise Vital Sign    Days of Exercise per Week: 2 days    Minutes of Exercise per Session: 30 min  Stress: Stress Concern Present (08/06/2023)   [employer] of Occupational Health - Occupational Stress Questionnaire    Feeling of Stress: To some extent  Social Connections: Moderately Integrated (07/22/2023)   Social Connection and Isolation Panel    Frequency of Communication with Friends and Family:  Three times a week    Frequency of Social Gatherings with Friends and Family: Three times a week    Attends Religious Services: More than 4 times per year    Active Member of Clubs or Organizations: Yes    Attends Banker Meetings: More than 4 times per year    Marital Status: Divorced  Intimate Partner Violence: Not At Risk (09/06/2023)   Epic    Fear of Current or Ex-Partner: No    Emotionally Abused: No    Physically Abused: No    Sexually Abused: No  Depression (PHQ2-9): Low Risk (09/09/2023)   Depression (PHQ2-9)    PHQ-2 Score: 0  Recent Concern: Depression (PHQ2-9) - High Risk (08/06/2023)   Depression (PHQ2-9)    PHQ-2 Score: 15  Alcohol Screen: Low Risk (08/06/2023)   Alcohol Screen    Last Alcohol Screening Score (AUDIT): 2  Housing: Unknown (09/06/2023)   Epic    Unable to Pay for Housing in the Last Year: No    Number of Times Moved in the Last Year: Not on file    Homeless in the Last Year: No  Utilities: Not At Risk (09/06/2023)   Epic    Threatened with loss of utilities: No  Health Literacy: Adequate Health Literacy (08/06/2023)   B1300 Health Literacy    Frequency of need for help with medical instructions: Never    Family History: Family History  Problem Relation Age of Onset   Breast cancer Mother 31   Heart attack Maternal Grandfather 7 - 49   Breast cancer Maternal Grandmother 41 - 89   Stroke Half-Sister 9   Ovarian cancer Maternal Aunt 52 - 69   Cancer - Other Maternal Aunt        blood and/or bone cancer? treated with transfusion or transplant, not leukemia   Heart attack Maternal Uncle    Lung cancer Maternal Cousin 49       metastatic to brain   Anesthesia problems Neg Hx    Hypotension Neg Hx    Malignant hyperthermia Neg Hx    Pseudochol deficiency Neg Hx     Review of Systems: ROS  Physical Exam: Vital Signs There were no vitals taken for this visit.  Physical Exam  Constitutional:      General: Not in acute distress.     Appearance: Normal appearance. Not ill-appearing.  HENT:     Head: Normocephalic and atraumatic.  Eyes:     Pupils: Pupils are equal, round Neck:     Musculoskeletal: Normal range of motion.  Cardiovascular:     Rate and Rhythm: Normal rate    Pulses: Normal pulses.  Pulmonary:     Effort: Pulmonary effort is normal. No respiratory distress.  Abdominal:     General: Abdomen is flat. There is no distension.   Breast: As noted patient with marked asymmetry with the left breast larger than the right. She  has grade 2 ptosis bilaterally. She has an 8 cm base width on the right  Musculoskeletal: Normal range of motion.  Skin:    General: Skin is warm and dry.     Findings: No erythema or rash.  Neurological:     General: No focal deficit present.     Mental Status: Alert and oriented to person, place, and time. Mental status is at baseline.     Motor: No weakness.  Psychiatric:        Mood and Affect: Mood normal.        Behavior: Behavior normal.    Assessment/Plan: The patient is scheduled for placement of bilateral tissue expanders after bilateral prophylactic mastectomies with Dr. Waddell.  Risks, benefits, and alternatives of procedure discussed, questions answered and consent obtained.  I personally discussed the risks of bleeding, infection, and seroma formation.  She understands that she will need to have either the tissue expanders or implants removed if she has an infection.  Smoking Status: Patient has stopped smoking cigarettes and is vaping at 25% of her previous level; Counseling Given?  Yes Last Mammogram: October 2025; Results: BI-RADS 1  Caprini Score: 3; Risk Factors include: Age less than 60, BMI less than 25, and length of planned surgery. Recommendation for mechanical  prophylaxis. Encourage early ambulation.   Pictures obtained: @consult  yes  Post-op Rx sent to pharmacy: Oxycodone , Zofran   Patient was provided with the Mentor implant and General Surgical Risk  consent document and Pain Medication Agreement prior to their appointment.  They had adequate time to read through the risk consent documents and Pain Medication Agreement. We also discussed them in person together during this preop appointment. All of their questions were answered to their satisfaction.  Recommended calling if they have any further questions.  Risk consent form and Pain Medication Agreement to be scanned into patient's chart.  Procedure, postop course, drains discussed with patient and her family.  All questions were answered and they are eager to proceed.   Electronically signed by: Leonce KATHEE Waddell, MD 01/29/2024 10:40 AM      [1]  Current Outpatient Medications:    BLACK COHOSH PO, Take 2 tablets by mouth daily at 12 noon., Disp: , Rfl:    cholecalciferol (VITAMIN D3) 25 MCG (1000 UNIT) tablet, Take 1,000 Units by mouth daily., Disp: , Rfl:    EPINEPHrine  0.3 mg/0.3 mL IJ SOAJ injection, Inject 0.3 mg into the muscle as needed for anaphylaxis., Disp: 1 each, Rfl: 0   escitalopram  (LEXAPRO ) 20 MG tablet, TAKE 1 TABLET BY MOUTH EVERY DAY, Disp: 90 tablet, Rfl: 0   hydrOXYzine  (VISTARIL ) 50 MG capsule, Take 1 capsule (50 mg total) by mouth every 8 (eight) hours as needed., Disp: 90 capsule, Rfl: 1   Iron , Ferrous Sulfate , 325 (65 Fe) MG TABS, Take 325 mg by mouth daily., Disp: 90 tablet, Rfl: 0   Misc Natural Products (OSTEO BI-FLEX TRIPLE STRENGTH) TABS, Take 0.5 tablets by mouth daily at 12 noon., Disp: , Rfl:    Multiple Vitamins-Minerals (MULTIVITAMIN WOMEN 50+) TABS, Take 1 tablet by mouth daily., Disp: , Rfl:    pantoprazole  (PROTONIX ) 40 MG tablet, Take 1 tablet (40 mg total) by mouth 2 (two) times daily before a meal., Disp: 90 tablet, Rfl: 0   varenicline  (CHANTIX ) 0.5 MG tablet, Take 1 tablet (0.5 mg total) by mouth daily., Disp: 90 tablet, Rfl: 0   vitamin E 180 MG (400 UNITS) capsule, Take 400 Units by mouth daily at 12  noon., Disp: , Rfl:   "

## 2024-01-29 NOTE — Progress Notes (Signed)
 "    Patient ID: Shawna Mcbride, female    DOB: 08-02-1965, 58 y.o.   MRN: [id number]  No chief complaint on file.   No diagnosis found.   History of Present Illness: Shawna Mcbride is a 58 y.o.  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  She presents for preoperative evaluation for upcoming procedure, placement of bilateral tissue expanders after mastectomy, scheduled for January 14 with Dr. Waddell.  The patient has not had problems with anesthesia.   Summary of Previous Visit: [name] is a 58 year old female who is referred from Dr. Ebbie for discussion of bilateral breast reconstruction after mastectomy for risk reduction. Patient was found to be BRCA positive and is requesting bilateral mastectomies. She denies any prior breast issues. Patient does smoke but denies any history of DVT. She has a history of anemia after a GI bleed. She is undergoing iron  replacement therapy for this. Patient has talked to her primary care provider regarding smoking cessation options and has started to cut back. Her goal is to be completely smoke-free by the time she undergoes surgery.   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  Last hemoglobin on October 29 was 14.8   Past Medical History: Allergies: NSAIDs and sulfa medications  Current Medications: Current Medications[1]  Past Medical Problems: Past Medical History:  Diagnosis Date   Arthritis     Past Surgical History: Past Surgical History:  Procedure Laterality Date   ABDOMINAL HYSTERECTOMY     ESOPHAGOGASTRODUODENOSCOPY  01/02/2011   Procedure: ESOPHAGOGASTRODUODENOSCOPY (EGD);  Surgeon: Lamar LULLA Bunk, MD;  Location: [facility] ENDOSCOPY;  Service: Endoscopy;  Laterality: N/A;  ok to do in endo unit, regular adult endoscope   ESOPHAGOGASTRODUODENOSCOPY Left 07/23/2023   Procedure: EGD (ESOPHAGOGASTRODUODENOSCOPY);  Surgeon: Burnette Fallow, MD;  Location: [facility] ENDOSCOPY;   Service: Gastroenterology;  Laterality: Left;    Social History: Social History   Socioeconomic History   Marital status: Legally Separated    Spouse name: Not on file   Number of children: Not on file   Years of education: Not on file   Highest education level: Not on file  Occupational History   Not on file  Tobacco Use   Smoking status: Every Day    Current packs/day: 0.50    Types: Cigarettes, E-cigarettes   Smokeless tobacco: Never  Vaping Use   Vaping status: Some Days   Substances: Nicotine   Substance and Sexual Activity   Alcohol use: Yes    Comment: at least 4 beers per day   Drug use: Yes    Types: Marijuana   Sexual activity: Yes  Other Topics Concern   Not on file  Social History Narrative   Not on file   Social Drivers of Health   Tobacco Use: High Risk (12/31/2023)   Received from [facility]   Patient History    Smoking Tobacco Use: Every Day    Smokeless Tobacco Use: Never    Passive Exposure: Not on file  Financial Resource Strain: Low Risk (08/06/2023)   Overall Financial Resource Strain (CARDIA)    Difficulty of Paying Living Expenses: Not hard at all  Food Insecurity: No Food Insecurity (09/06/2023)   Epic    Worried About Radiation Protection Practitioner of Food in the Last Year: Never true    Ran Out of Food in the Last Year: Never true  Transportation Needs: No Transportation Needs (09/06/2023)  Epic    Lack of Transportation (Medical): No    Lack of Transportation (Non-Medical): No  Physical Activity: Insufficiently Active (08/06/2023)   Exercise Vital Sign    Days of Exercise per Week: 2 days    Minutes of Exercise per Session: 30 min  Stress: Stress Concern Present (08/06/2023)   [employer] of Occupational Health - Occupational Stress Questionnaire    Feeling of Stress: To some extent  Social Connections: Moderately Integrated (07/22/2023)   Social Connection and Isolation Panel    Frequency of Communication with Friends and Family:  Three times a week    Frequency of Social Gatherings with Friends and Family: Three times a week    Attends Religious Services: More than 4 times per year    Active Member of Clubs or Organizations: Yes    Attends Banker Meetings: More than 4 times per year    Marital Status: Divorced  Intimate Partner Violence: Not At Risk (09/06/2023)   Epic    Fear of Current or Ex-Partner: No    Emotionally Abused: No    Physically Abused: No    Sexually Abused: No  Depression (PHQ2-9): Low Risk (09/09/2023)   Depression (PHQ2-9)    PHQ-2 Score: 0  Recent Concern: Depression (PHQ2-9) - High Risk (08/06/2023)   Depression (PHQ2-9)    PHQ-2 Score: 15  Alcohol Screen: Low Risk (08/06/2023)   Alcohol Screen    Last Alcohol Screening Score (AUDIT): 2  Housing: Unknown (09/06/2023)   Epic    Unable to Pay for Housing in the Last Year: No    Number of Times Moved in the Last Year: Not on file    Homeless in the Last Year: No  Utilities: Not At Risk (09/06/2023)   Epic    Threatened with loss of utilities: No  Health Literacy: Adequate Health Literacy (08/06/2023)   B1300 Health Literacy    Frequency of need for help with medical instructions: Never    Family History: Family History  Problem Relation Age of Onset   Breast cancer Mother 31   Heart attack Maternal Grandfather 7 - 49   Breast cancer Maternal Grandmother 41 - 89   Stroke Half-Sister 9   Ovarian cancer Maternal Aunt 52 - 69   Cancer - Other Maternal Aunt        blood and/or bone cancer? treated with transfusion or transplant, not leukemia   Heart attack Maternal Uncle    Lung cancer Maternal Cousin 49       metastatic to brain   Anesthesia problems Neg Hx    Hypotension Neg Hx    Malignant hyperthermia Neg Hx    Pseudochol deficiency Neg Hx     Review of Systems: ROS  Physical Exam: Vital Signs There were no vitals taken for this visit.  Physical Exam  Constitutional:      General: Not in acute distress.     Appearance: Normal appearance. Not ill-appearing.  HENT:     Head: Normocephalic and atraumatic.  Eyes:     Pupils: Pupils are equal, round Neck:     Musculoskeletal: Normal range of motion.  Cardiovascular:     Rate and Rhythm: Normal rate    Pulses: Normal pulses.  Pulmonary:     Effort: Pulmonary effort is normal. No respiratory distress.  Abdominal:     General: Abdomen is flat. There is no distension.   Breast: As noted patient with marked asymmetry with the left breast larger than the right. She  has grade 2 ptosis bilaterally. She has an 8 cm base width on the right  Musculoskeletal: Normal range of motion.  Skin:    General: Skin is warm and dry.     Findings: No erythema or rash.  Neurological:     General: No focal deficit present.     Mental Status: Alert and oriented to person, place, and time. Mental status is at baseline.     Motor: No weakness.  Psychiatric:        Mood and Affect: Mood normal.        Behavior: Behavior normal.    Assessment/Plan: The patient is scheduled for placement of bilateral tissue expanders after bilateral prophylactic mastectomies with Dr. Waddell.  Risks, benefits, and alternatives of procedure discussed, questions answered and consent obtained.  I personally discussed the risks of bleeding, infection, and seroma formation.  She understands that she will need to have either the tissue expanders or implants removed if she has an infection.  Smoking Status: Patient has stopped smoking cigarettes and is vaping at 25% of her previous level; Counseling Given?  Yes Last Mammogram: October 2025; Results: BI-RADS 1  Caprini Score: 3; Risk Factors include: Age less than 60, BMI less than 25, and length of planned surgery. Recommendation for mechanical  prophylaxis. Encourage early ambulation.   Pictures obtained: @consult  yes  Post-op Rx sent to pharmacy: Oxycodone , Zofran   Patient was provided with the Mentor implant and General Surgical Risk  consent document and Pain Medication Agreement prior to their appointment.  They had adequate time to read through the risk consent documents and Pain Medication Agreement. We also discussed them in person together during this preop appointment. All of their questions were answered to their satisfaction.  Recommended calling if they have any further questions.  Risk consent form and Pain Medication Agreement to be scanned into patient's chart.  Procedure, postop course, drains discussed with patient and her family.  All questions were answered and they are eager to proceed.   Electronically signed by: Leonce KATHEE Waddell, MD 01/29/2024 10:40 AM      [1]  Current Outpatient Medications:    BLACK COHOSH PO, Take 2 tablets by mouth daily at 12 noon., Disp: , Rfl:    cholecalciferol (VITAMIN D3) 25 MCG (1000 UNIT) tablet, Take 1,000 Units by mouth daily., Disp: , Rfl:    EPINEPHrine  0.3 mg/0.3 mL IJ SOAJ injection, Inject 0.3 mg into the muscle as needed for anaphylaxis., Disp: 1 each, Rfl: 0   escitalopram  (LEXAPRO ) 20 MG tablet, TAKE 1 TABLET BY MOUTH EVERY DAY, Disp: 90 tablet, Rfl: 0   hydrOXYzine  (VISTARIL ) 50 MG capsule, Take 1 capsule (50 mg total) by mouth every 8 (eight) hours as needed., Disp: 90 capsule, Rfl: 1   Iron , Ferrous Sulfate , 325 (65 Fe) MG TABS, Take 325 mg by mouth daily., Disp: 90 tablet, Rfl: 0   Misc Natural Products (OSTEO BI-FLEX TRIPLE STRENGTH) TABS, Take 0.5 tablets by mouth daily at 12 noon., Disp: , Rfl:    Multiple Vitamins-Minerals (MULTIVITAMIN WOMEN 50+) TABS, Take 1 tablet by mouth daily., Disp: , Rfl:    pantoprazole  (PROTONIX ) 40 MG tablet, Take 1 tablet (40 mg total) by mouth 2 (two) times daily before a meal., Disp: 90 tablet, Rfl: 0   varenicline  (CHANTIX ) 0.5 MG tablet, Take 1 tablet (0.5 mg total) by mouth daily., Disp: 90 tablet, Rfl: 0   vitamin E 180 MG (400 UNITS) capsule, Take 400 Units by mouth daily at 12  noon., Disp: , Rfl:   "

## 2024-02-12 ENCOUNTER — Other Ambulatory Visit: Payer: Self-pay

## 2024-02-12 ENCOUNTER — Encounter (HOSPITAL_BASED_OUTPATIENT_CLINIC_OR_DEPARTMENT_OTHER): Payer: Self-pay | Admitting: General Surgery

## 2024-02-17 MED ORDER — CHLORHEXIDINE GLUCONATE CLOTH 2 % EX PADS: 6.0000 | MEDICATED_PAD | Freq: Once | CUTANEOUS | Status: DC

## 2024-02-17 NOTE — Progress Notes (Signed)
" ° ° ° ° °  Enhanced Recovery after Surgery Enhanced Recovery after Surgery is a protocol used to improve the stress on your body and your recovery after surgery.  Patient Instructions  The night before surgery:  No food after midnight. ONLY clear liquids after midnight  The day of surgery (if you do NOT have diabetes):  Drink ONE (1) Pre-Surgery Clear Ensure as directed.   This drink was given to you during your hospital  pre-op appointment visit. The pre-op nurse will instruct you on the time to drink the  Pre-Surgery Ensure depending on your surgery time. Finish the drink at the designated time by the pre-op nurse.  Nothing else to drink after completing the  Pre-Surgery Clear Ensure.  The day of surgery (if you have diabetes): Drink ONE (1) Gatorade 2 (G2) as directed. This drink was given to you during your hospital  pre-op appointment visit.  The pre-op nurse will instruct you on the time to drink the   Gatorade 2 (G2) depending on your surgery time. Color of the Gatorade may vary. Red is not allowed. Nothing else to drink after completing the  Gatorade 2 (G2).         If you have questions, please contact your surgeons office.  Surgical soap with with instructions "

## 2024-02-18 ENCOUNTER — Other Ambulatory Visit: Payer: Self-pay | Admitting: Family

## 2024-02-18 DIAGNOSIS — Z716 Tobacco abuse counseling: Secondary | ICD-10-CM

## 2024-02-18 NOTE — Telephone Encounter (Signed)
 Complete

## 2024-02-18 NOTE — H&P (Signed)
 " 59 year old female who has not been to the doctor in some time. She was admitted to the hospital earlier this year for anemia secondary an upper GI bleed and a duodenal ulcer. This is resolved. She has been treated for an iron  deficiency anemia and is due to get a colonoscopy soon with Dr. Burnette. While undergoing all this process she received information about the gene connect program at [facility]. She then underwent genetic testing that showed a pathogenic BRCA2 mutation. She is referred to discuss this. She has no prior breast history herself. She does have ovarian cancer on her mom side as well as a mom in her 59s with breast cancer as well as a maternal grandmother in her 21s was at breast cancer. She has not had a mammogram in about 10 years. She has no complaints referable to either breast right now.  Since her last visit she has a negative contrast mammogram. She has C density breast tissue. She also also seen plastic surgery. She has not smoked any cigarettes and is vaping about a quarter of what she did before.  Review of Systems: A complete review of systems was obtained from the patient. I have reviewed this information and discussed as appropriate with the patient. See HPI as well for other ROS.  Review of Systems  All other systems reviewed and are negative.  Medical History: Past Medical History:  Diagnosis Date  Anemia  Anxiety  Arthritis  GERD (gastroesophageal reflux disease)   Past Surgical History:  Procedure Laterality Date  HYSTERECTOMY   Allergies  Allergen Reactions  Nsaids (Non-Steroidal Anti-Inflammatory Drug) Other (See Comments)  Sulfa (Sulfonamide Antibiotics) Hives   Current Outpatient Medications on File Prior to Visit  Medication Sig Dispense Refill  black cohosh 540 mg Cap Take 2 tablets by mouth once daily  cholecalciferol (VITAMIN D3) 1000 unit tablet Take 1,000 Units by mouth once daily  EPINEPHrine  (EPIPEN ) 0.3 mg/0.3 mL auto-injector Inject 0.3 mg into  the muscle as needed for Anaphylaxis  escitalopram  oxalate (LEXAPRO  ORAL)  hydrOXYzine  (VISTARIL ) 50 MG capsule Take 50 mg by mouth  multivitamin tablet Take 1 tablet by mouth once daily  pantoprazole  (PROTONIX ) 40 MG DR tablet  varenicline  tartrate (CHANTIX ) 0.5 MG tablet Take 0.5 mg by mouth once daily  vitamin E 400 UNIT capsule Take 400 Units by mouth  ferrous sulfate  325 (65 FE) MG tablet Take 325 mg by mouth once daily  folic acid  (FOLVITE ) 1 MG tablet   Family History  Problem Relation Age of Onset  Breast cancer Mother  Skin cancer Sister  High blood pressure (Hypertension) Brother  Hyperlipidemia (Elevated cholesterol) Brother    Social History   Tobacco Use  Smoking Status Every Day  Types: Cigarettes  Smokeless Tobacco Never  Marital status: Legally Separated  Tobacco Use  Smoking status: Every Day  Types: Cigarettes  Smokeless tobacco: Never  Vaping Use  Vaping status: Unknown  Substance and Sexual Activity  Alcohol use: Never  Drug use: Yes    Objective:     Physical Exam Vitals reviewed.  Constitutional:  Appearance: Normal appearance.  Chest:  Breasts: Right: No inverted nipple, mass or nipple discharge.  Left: No inverted nipple, mass or nipple discharge.  Lymphadenopathy:  Upper Body:  Right upper body: No supraclavicular or axillary adenopathy.  Left upper body: No supraclavicular or axillary adenopathy.  Neurological:  Mental Status: She is alert.    Assessment and Plan:   BRCA2 positive  Bilateral risk-reducing mastectomies  . She  was fairly adamant about proceeding with surgery. She is seeing plastic surgery at this point. She has a negative contrast mammogram and I think that is adequate to proceed with surgery at this point. We discussed bilateral risk-reducing mastectomies. These will not be nipple sparing. She is seeing plastic surgery who plans to place expanders at the same time. We discussed risks as well as recovery today. Will  plan on coordinating with plastic surgery and getting her scheduled.  "

## 2024-02-19 ENCOUNTER — Ambulatory Visit (HOSPITAL_BASED_OUTPATIENT_CLINIC_OR_DEPARTMENT_OTHER): Payer: Self-pay | Admitting: Anesthesiology

## 2024-02-19 ENCOUNTER — Encounter (HOSPITAL_BASED_OUTPATIENT_CLINIC_OR_DEPARTMENT_OTHER): Payer: Self-pay | Admitting: General Surgery

## 2024-02-19 ENCOUNTER — Other Ambulatory Visit: Payer: Self-pay

## 2024-02-19 ENCOUNTER — Observation Stay (HOSPITAL_BASED_OUTPATIENT_CLINIC_OR_DEPARTMENT_OTHER)
Admission: RE | Admit: 2024-02-19 | Discharge: 2024-02-20 | Disposition: A | Discharge status: Home / Self Care | Attending: General Surgery | Admitting: General Surgery

## 2024-02-19 ENCOUNTER — Encounter (HOSPITAL_BASED_OUTPATIENT_CLINIC_OR_DEPARTMENT_OTHER)
Admission: RE | Disposition: A | Discharge status: Home / Self Care | Payer: Self-pay | Source: Home / Self Care | Attending: General Surgery

## 2024-02-19 DIAGNOSIS — F17210 Nicotine dependence, cigarettes, uncomplicated: Secondary | ICD-10-CM | POA: Insufficient documentation

## 2024-02-19 DIAGNOSIS — L7632 Postprocedural hematoma of skin and subcutaneous tissue following other procedure: Secondary | ICD-10-CM | POA: Insufficient documentation

## 2024-02-19 DIAGNOSIS — Z1501 Genetic susceptibility to malignant neoplasm of breast: Secondary | ICD-10-CM | POA: Diagnosis not present

## 2024-02-19 DIAGNOSIS — Z421 Encounter for breast reconstruction following mastectomy: Secondary | ICD-10-CM | POA: Diagnosis not present

## 2024-02-19 DIAGNOSIS — F1290 Cannabis use, unspecified, uncomplicated: Secondary | ICD-10-CM | POA: Insufficient documentation

## 2024-02-19 DIAGNOSIS — Z9013 Acquired absence of bilateral breasts and nipples: Principal | ICD-10-CM

## 2024-02-19 HISTORY — PX: TOTAL MASTECTOMY: SHX6129

## 2024-02-19 HISTORY — DX: Anemia, unspecified: D64.9

## 2024-02-19 HISTORY — DX: Anxiety disorder, unspecified: F41.9

## 2024-02-19 HISTORY — PX: TISSUE EXPANDER PLACEMENT: SHX2530

## 2024-02-19 HISTORY — DX: Gastro-esophageal reflux disease without esophagitis: K21.9

## 2024-02-19 HISTORY — DX: Depression, unspecified: F32.A

## 2024-02-19 MED ORDER — EPHEDRINE SULFATE-NACL 50-0.9 MG/10ML-% IV SOSY
PREFILLED_SYRINGE | INTRAVENOUS | Status: DC | PRN
  Administered 2024-02-19 (×4): 5 mg via INTRAVENOUS

## 2024-02-19 MED ORDER — CEFAZOLIN SODIUM-DEXTROSE 2-4 GM/100ML-% IV SOLN: 2.0000 g | INTRAVENOUS | Status: DC

## 2024-02-19 MED ORDER — PHENYLEPHRINE 80 MCG/ML (10ML) SYRINGE FOR IV PUSH (FOR BLOOD PRESSURE SUPPORT)
PREFILLED_SYRINGE | INTRAVENOUS | Status: DC | PRN
  Administered 2024-02-19 (×3): 80 ug via INTRAVENOUS

## 2024-02-19 MED ORDER — ENSURE PRE-SURGERY PO LIQD: 296.0000 mL | Freq: Once | ORAL | Status: DC

## 2024-02-19 MED ORDER — SODIUM CHLORIDE 0.9 % IV SOLN: INTRAVENOUS | Status: DC

## 2024-02-19 MED ORDER — POVIDONE-IODINE 10 % EX SOLN
CUTANEOUS | Status: DC | PRN
  Administered 2024-02-19: 1 via TOPICAL

## 2024-02-19 MED ORDER — CHLORHEXIDINE GLUCONATE CLOTH 2 % EX PADS: 6.0000 | MEDICATED_PAD | Freq: Once | CUTANEOUS | Status: DC

## 2024-02-19 MED ORDER — ACETAMINOPHEN 500 MG PO TABS
1000.0000 mg | ORAL_TABLET | ORAL | Status: AC
  Administered 2024-02-19: 1000 mg via ORAL

## 2024-02-19 MED ORDER — ONDANSETRON 4 MG PO TBDP: 4.0000 mg | ORAL_TABLET | Freq: Four times a day (QID) | ORAL | Status: DC | PRN

## 2024-02-19 MED ORDER — METHOCARBAMOL 500 MG PO TABS
500.0000 mg | ORAL_TABLET | Freq: Three times a day (TID) | ORAL | Status: DC | PRN
  Administered 2024-02-20: 500 mg via ORAL
  Filled 2024-02-19: qty 1

## 2024-02-19 MED ORDER — OXYCODONE HCL 5 MG PO TABS: 5.0000 mg | ORAL_TABLET | ORAL | Status: DC | PRN

## 2024-02-19 MED ORDER — HYDROXYZINE HCL 25 MG PO TABS
50.0000 mg | ORAL_TABLET | Freq: Three times a day (TID) | ORAL | Status: DC | PRN
  Administered 2024-02-19: 50 mg via ORAL
  Filled 2024-02-19: qty 2

## 2024-02-19 MED ORDER — LIDOCAINE 2% (20 MG/ML) 5 ML SYRINGE
INTRAMUSCULAR | Status: DC | PRN
  Administered 2024-02-19: 40 mg via INTRAVENOUS

## 2024-02-19 MED ORDER — PHENYLEPHRINE 80 MCG/ML (10ML) SYRINGE FOR IV PUSH (FOR BLOOD PRESSURE SUPPORT)
PREFILLED_SYRINGE | INTRAVENOUS | Status: AC
  Filled 2024-02-19: qty 10

## 2024-02-19 MED ORDER — ACETAMINOPHEN 500 MG PO TABS
ORAL_TABLET | ORAL | Status: AC
  Filled 2024-02-19: qty 2

## 2024-02-19 MED ORDER — ACETAMINOPHEN 500 MG PO TABS
1000.0000 mg | ORAL_TABLET | Freq: Four times a day (QID) | ORAL | Status: DC
  Administered 2024-02-19 – 2024-02-20 (×3): 1000 mg via ORAL
  Filled 2024-02-19 (×3): qty 2

## 2024-02-19 MED ORDER — LACTATED RINGERS IV SOLN: INTRAVENOUS | Status: DC

## 2024-02-19 MED ORDER — MIDAZOLAM HCL 2 MG/2ML IJ SOLN
INTRAMUSCULAR | Status: AC
  Filled 2024-02-19: qty 2

## 2024-02-19 MED ORDER — SODIUM CHLORIDE 0.9 % IV SOLN
INTRAVENOUS | Status: AC
  Filled 2024-02-19: qty 10

## 2024-02-19 MED ORDER — MORPHINE SULFATE (PF) 4 MG/ML IV SOLN: 2.0000 mg | INTRAVENOUS | Status: DC | PRN

## 2024-02-19 MED ORDER — DEXMEDETOMIDINE HCL IN NACL 80 MCG/20ML IV SOLN
INTRAVENOUS | Status: AC
  Filled 2024-02-19: qty 20

## 2024-02-19 MED ORDER — KETAMINE HCL 50 MG/5ML IJ SOSY
PREFILLED_SYRINGE | INTRAMUSCULAR | Status: DC | PRN
  Administered 2024-02-19: 30 mg via INTRAVENOUS

## 2024-02-19 MED ORDER — PANTOPRAZOLE SODIUM 40 MG PO TBEC
40.0000 mg | DELAYED_RELEASE_TABLET | Freq: Two times a day (BID) | ORAL | Status: DC
  Administered 2024-02-19: 40 mg via ORAL
  Filled 2024-02-19: qty 1

## 2024-02-19 MED ORDER — SIMETHICONE 80 MG PO CHEW: 40.0000 mg | CHEWABLE_TABLET | Freq: Four times a day (QID) | ORAL | Status: DC | PRN

## 2024-02-19 MED ORDER — ALBUMIN HUMAN 5 % IV SOLN: INTRAVENOUS | Status: DC | PRN

## 2024-02-19 MED ORDER — FENTANYL CITRATE (PF) 100 MCG/2ML IJ SOLN
INTRAMUSCULAR | Status: AC
  Filled 2024-02-19: qty 2

## 2024-02-19 MED ORDER — METHOCARBAMOL 1000 MG/10ML IJ SOLN: 500.0000 mg | Freq: Three times a day (TID) | INTRAMUSCULAR | Status: DC | PRN

## 2024-02-19 MED ORDER — PROPOFOL 10 MG/ML IV BOLUS
INTRAVENOUS | Status: DC | PRN
  Administered 2024-02-19: 20 mg via INTRAVENOUS
  Administered 2024-02-19: 140 ug via INTRAVENOUS

## 2024-02-19 MED ORDER — DEXAMETHASONE SOD PHOSPHATE PF 10 MG/ML IJ SOLN
INTRAMUSCULAR | Status: AC
  Filled 2024-02-19: qty 1

## 2024-02-19 MED ORDER — ESCITALOPRAM OXALATE 20 MG PO TABS
20.0000 mg | ORAL_TABLET | Freq: Every day | ORAL | Status: DC
  Filled 2024-02-19: qty 1

## 2024-02-19 MED ORDER — BUPIVACAINE LIPOSOME 1.3 % IJ SUSP
INTRAMUSCULAR | Status: DC | PRN
  Administered 2024-02-19 (×2): 5 mL via PERINEURAL

## 2024-02-19 MED ORDER — ONDANSETRON HCL 4 MG/2ML IJ SOLN
INTRAMUSCULAR | Status: DC | PRN
  Administered 2024-02-19: 4 mg via INTRAVENOUS

## 2024-02-19 MED ORDER — TRANEXAMIC ACID 1000 MG/10ML IV SOLN
INTRAVENOUS | Status: AC
  Filled 2024-02-19: qty 60

## 2024-02-19 MED ORDER — CEFAZOLIN SODIUM-DEXTROSE 2-3 GM-%(50ML) IV SOLR
INTRAVENOUS | Status: DC | PRN
  Administered 2024-02-19: 2 g via INTRAVENOUS

## 2024-02-19 MED ORDER — FENTANYL CITRATE (PF) 100 MCG/2ML IJ SOLN
100.0000 ug | Freq: Once | INTRAMUSCULAR | Status: AC
  Administered 2024-02-19: 100 ug via INTRAVENOUS

## 2024-02-19 MED ORDER — ROCURONIUM BROMIDE 10 MG/ML (PF) SYRINGE
PREFILLED_SYRINGE | INTRAVENOUS | Status: AC
  Filled 2024-02-19: qty 10

## 2024-02-19 MED ORDER — ONDANSETRON HCL 4 MG/2ML IJ SOLN
INTRAMUSCULAR | Status: AC
  Filled 2024-02-19: qty 2

## 2024-02-19 MED ORDER — SODIUM CHLORIDE 0.9 % IR SOLN
Status: DC | PRN
  Administered 2024-02-19: 50 mL

## 2024-02-19 MED ORDER — PROPOFOL 500 MG/50ML IV EMUL
INTRAVENOUS | Status: DC | PRN
  Administered 2024-02-19: 50 ug/kg/min via INTRAVENOUS

## 2024-02-19 MED ORDER — PROPOFOL 10 MG/ML IV BOLUS
INTRAVENOUS | Status: AC
  Filled 2024-02-19: qty 20

## 2024-02-19 MED ORDER — SODIUM CHLORIDE 0.9 % IV SOLN
INTRAVENOUS | Status: DC | PRN
  Administered 2024-02-19: 500 mL

## 2024-02-19 MED ORDER — HYDROMORPHONE HCL 1 MG/ML IJ SOLN: 0.2500 mg | INTRAMUSCULAR | Status: DC | PRN

## 2024-02-19 MED ORDER — KETAMINE HCL 50 MG/5ML IJ SOSY
PREFILLED_SYRINGE | INTRAMUSCULAR | Status: AC
  Filled 2024-02-19: qty 5

## 2024-02-19 MED ORDER — 0.9 % SODIUM CHLORIDE (POUR BTL) OPTIME
TOPICAL | Status: DC | PRN
  Administered 2024-02-19: 1000 mL

## 2024-02-19 MED ORDER — EPHEDRINE 5 MG/ML INJ
INTRAVENOUS | Status: AC
  Filled 2024-02-19: qty 5

## 2024-02-19 MED ORDER — ONDANSETRON HCL 4 MG/2ML IJ SOLN: 4.0000 mg | Freq: Four times a day (QID) | INTRAMUSCULAR | Status: DC | PRN

## 2024-02-19 MED ORDER — BUPIVACAINE-EPINEPHRINE (PF) 0.5% -1:200000 IJ SOLN
INTRAMUSCULAR | Status: DC | PRN
  Administered 2024-02-19 (×2): 15 mL via PERINEURAL

## 2024-02-19 MED ORDER — TRANEXAMIC ACID 1000 MG/10ML IV SOLN
Status: DC | PRN
  Administered 2024-02-19: 6000 mg via TOPICAL

## 2024-02-19 MED ORDER — ARTIFICIAL TEARS OPHTHALMIC OINT
TOPICAL_OINTMENT | OPHTHALMIC | Status: AC
  Filled 2024-02-19: qty 3.5

## 2024-02-19 MED ORDER — CHLORHEXIDINE GLUCONATE CLOTH 2 % EX PADS
6.0000 | MEDICATED_PAD | Freq: Once | CUTANEOUS | Status: AC
  Administered 2024-02-19: 6 via TOPICAL

## 2024-02-19 MED ORDER — CEFAZOLIN SODIUM-DEXTROSE 2-4 GM/100ML-% IV SOLN
INTRAVENOUS | Status: AC
  Filled 2024-02-19: qty 100

## 2024-02-19 MED ORDER — MIDAZOLAM HCL (PF) 2 MG/2ML IJ SOLN
2.0000 mg | Freq: Once | INTRAMUSCULAR | Status: AC
  Administered 2024-02-19: 2 mg via INTRAVENOUS

## 2024-02-19 MED ORDER — DEXMEDETOMIDINE HCL IN NACL 80 MCG/20ML IV SOLN
INTRAVENOUS | Status: DC | PRN
  Administered 2024-02-19 (×2): 4 ug via INTRAVENOUS

## 2024-02-19 MED ORDER — LIDOCAINE 2% (20 MG/ML) 5 ML SYRINGE
INTRAMUSCULAR | Status: AC
  Filled 2024-02-19: qty 5

## 2024-02-19 MED ORDER — FENTANYL CITRATE (PF) 100 MCG/2ML IJ SOLN
INTRAMUSCULAR | Status: DC | PRN
  Administered 2024-02-19 (×2): 50 ug via INTRAVENOUS

## 2024-02-19 MED ORDER — DEXAMETHASONE SOD PHOSPHATE PF 10 MG/ML IJ SOLN
INTRAMUSCULAR | Status: DC | PRN
  Administered 2024-02-19: 10 mg via INTRAVENOUS

## 2024-02-19 MED ORDER — BUPIVACAINE-EPINEPHRINE (PF) 0.5% -1:200000 IJ SOLN: INTRAMUSCULAR | Status: DC | PRN

## 2024-02-19 NOTE — Op Note (Signed)
 Preoperative diagnosis: BRCA 2 positive Postoperative diagnosis: saa Procedure: bilateral risk reducing mastectomies Surgeon: Dr Adina Bury Anesthesia: general with bilateral pectoral blocks EBL: < 50 cc Complications none Drains per plastic surgery Specimens: bilateral breast tissue with short stitch superior, long stitch lateral Sponge and needle count correct Dispo recovery stable  Indications: 48 yof with a BRCA 2 mutation who desires bilateral mastectomies. Will proceed with bilateral mastectomies and expander placement with plastic surgery.  Procedure: After informed consent obtained patient was taken to the OR.  Antibiotics were given.  SCDs were in place.  She was placed under general anesthesia without complication.  She was prepped and draped in the standard sterile surgical fashion.  Timeout was performed.  I did the left mastectomy first.  I made an elliptical incision to encompass the NAC.  I created flaps to the clavicle, IM fold, sternum and latissimus laterally. I then removed the breast and pectoralis fascia.  This was marked as above and passed off the table.  I then obtained hemostasis. I placed a TXA soaked sponge and left this side for reconstruction.  I then did the right mastectomy in a similar fashion. This tissue was passed off the table and I placed a TXA soaked sponge and turned the case over to plastic surgery.

## 2024-02-19 NOTE — Anesthesia Postprocedure Evaluation (Signed)
"   Anesthesia Post Note  Patient: Shawna Mcbride  Procedure(s) Performed: MASTECTOMY, SIMPLE (Bilateral: Breast) INSERTION, TISSUE EXPANDER WITH FLEX HD (Bilateral: Breast)     Patient location during evaluation: PACU Anesthesia Type: General and Regional Level of consciousness: awake and alert Pain management: pain level controlled Vital Signs Assessment: post-procedure vital signs reviewed and stable Respiratory status: spontaneous breathing, nonlabored ventilation and respiratory function stable Cardiovascular status: blood pressure returned to baseline and stable Postop Assessment: no apparent nausea or vomiting Anesthetic complications: no   No notable events documented.  Last Vitals:  Vitals:   02/19/24 1220 02/19/24 1230  BP:  123/85  Pulse: 88 81  Resp: (!) 24 18  Temp:    SpO2: 97% 94%    Last Pain:  Vitals:   02/19/24 1230  TempSrc:   PainSc: 0-No pain                 Lanita Stammen,W. EDMOND      "

## 2024-02-19 NOTE — Transfer of Care (Signed)
 Immediate Anesthesia Transfer of Care Note  Patient: Shawna Mcbride  Procedure(s) Performed: MASTECTOMY, SIMPLE (Bilateral: Breast) INSERTION, TISSUE EXPANDER WITH FLEX HD (Bilateral: Breast)  Patient Location: PACU  Anesthesia Type:General  Level of Consciousness: awake and alert   Airway & Oxygen Therapy: Patient Spontanous Breathing and Patient connected to face mask oxygen  Post-op Assessment: Report given to RN and Post -op Vital signs reviewed and stable  Post vital signs: Reviewed and stable  Last Vitals:  Vitals Value Taken Time  BP 128/87 02/19/24 12:15  Temp    Pulse 90 02/19/24 12:21  Resp 20 02/19/24 12:21  SpO2 96 % 02/19/24 12:21  Vitals shown include unfiled device data.  Last Pain:  Vitals:   02/19/24 0706  TempSrc: Temporal  PainSc: 0-No pain      Patients Stated Pain Goal: 4 (02/19/24 0706)  Complications: No notable events documented.

## 2024-02-19 NOTE — Interval H&P Note (Signed)
 History and Physical Interval Note:  02/19/2024 8:20 AM  Shawna Mcbride  has presented today for surgery, with the diagnosis of BRCA 2 MUTATION.  The various methods of treatment have been discussed with the patient and family. After consideration of risks, benefits and other options for treatment, the patient has consented to  Procedures with comments: MASTECTOMY, SIMPLE (Bilateral) - GEN w/PEC BLOCK BILATERAL RISK REDUCING MASTECTOMIES INSERTION, TISSUE EXPANDER (Bilateral) as a surgical intervention.  The patient's history has been reviewed, patient examined, no change in status, stable for surgery.  I have reviewed the patient's chart and labs.  Questions were answered to the patient's satisfaction.     Donnice Bury

## 2024-02-19 NOTE — Interval H&P Note (Signed)
 History and Physical Interval Note: No change in exam or indication for surgery All questions answered Marked for bilateral mastectomies Will proceed at her request   Shawna Mcbride  has presented today for surgery, with the diagnosis of BRCA 2 MUTATION.  The various methods of treatment have been discussed with the patient and family. After consideration of risks, benefits and other options for treatment, the patient has consented to  Procedures with comments: MASTECTOMY, SIMPLE (Bilateral) - GEN w/PEC BLOCK BILATERAL RISK REDUCING MASTECTOMIES INSERTION, TISSUE EXPANDER (Bilateral) as a surgical intervention.  The patient's history has been reviewed, patient examined, no change in status, stable for surgery.  I have reviewed the patient's chart and labs.  Questions were answered to the patient's satisfaction.     [name]

## 2024-02-19 NOTE — Progress Notes (Signed)
 Assisted Dr. Marney Needle with left and right, pectoralis, ultrasound guided block. Side rails up, monitors on throughout procedure. See vital signs in flow sheet. Tolerated Procedure well.

## 2024-02-19 NOTE — Anesthesia Preprocedure Evaluation (Addendum)
"                                    Anesthesia Evaluation  Patient identified by MRN, date of birth, ID band Patient awake    Reviewed: Allergy & Precautions, H&P , NPO status , Patient's Chart, lab work & pertinent test results  Airway Mallampati: I  TM Distance: >3 FB Neck ROM: Full    Dental no notable dental hx. (+) Edentulous Upper, Partial Lower, Poor Dentition, Dental Advisory Given   Pulmonary Patient abstained from smoking., former smoker   Pulmonary exam normal breath sounds clear to auscultation       Cardiovascular negative cardio ROS  Rhythm:Regular Rate:Normal     Neuro/Psych   Anxiety Depression    negative neurological ROS     GI/Hepatic Neg liver ROS,GERD  Medicated,,  Endo/Other  negative endocrine ROS    Renal/GU negative Renal ROS  negative genitourinary   Musculoskeletal  (+) Arthritis , Osteoarthritis,    Abdominal   Peds  Hematology  (+) Blood dyscrasia, anemia   Anesthesia Other Findings   Reproductive/Obstetrics negative OB ROS                              Anesthesia Physical Anesthesia Plan  ASA: 2  Anesthesia Plan: General   Post-op Pain Management: Regional block* and Tylenol  PO (pre-op)*   Induction: Intravenous  PONV Risk Score and Plan: 4 or greater and Ondansetron , Dexamethasone  and Midazolam   Airway Management Planned: LMA  Additional Equipment:   Intra-op Plan:   Post-operative Plan: Extubation in OR  Informed Consent: I have reviewed the patients History and Physical, chart, labs and discussed the procedure including the risks, benefits and alternatives for the proposed anesthesia with the patient or authorized representative who has indicated his/her understanding and acceptance.     Dental advisory given  Plan Discussed with: CRNA  Anesthesia Plan Comments:          Anesthesia Quick Evaluation  "

## 2024-02-19 NOTE — Anesthesia Procedure Notes (Signed)
 Procedure Name: LMA Insertion Date/Time: 02/19/2024 8:40 AM  Performed by: Leotha Andrez DEL, CRNAPre-anesthesia Checklist: Patient identified, Emergency Drugs available, Suction available, Patient being monitored and Timeout performed Patient Re-evaluated:Patient Re-evaluated prior to induction Oxygen Delivery Method: Circle system utilized Preoxygenation: Pre-oxygenation with 100% oxygen Induction Type: IV induction Ventilation: Mask ventilation without difficulty LMA: LMA with gastric port inserted LMA Size: 3.0 Number of attempts: 1 Placement Confirmation: breath sounds checked- equal and bilateral and positive ETCO2 Tube secured with: Tape Dental Injury: Teeth and Oropharynx as per pre-operative assessment

## 2024-02-19 NOTE — Anesthesia Procedure Notes (Signed)
 Anesthesia Regional Block: Pectoralis block   Pre-Anesthetic Checklist: , timeout performed,  Correct Patient, Correct Site, Correct Laterality,  Correct Procedure, Correct Position, site marked,  Risks and benefits discussed,  Pre-op evaluation,  At surgeon's request and post-op pain management  Laterality: Left and Right  Prep: Maximum Sterile Barrier Precautions used, chloraprep       Needles:  Injection technique: Single-shot  Needle Type: Echogenic Stimulator Needle     Needle Length: 9cm  Needle Gauge: 21     Additional Needles:   Procedures:,,,, ultrasound used (permanent image in chart),,    Narrative:  Start time: 02/19/2024 8:03 AM End time: 02/19/2024 8:13 AM Injection made incrementally with aspirations every 5 mL.  Performed by: Personally  Anesthesiologist: Epifanio Fallow, MD  Additional Notes: Bilateral PECS blocks

## 2024-02-19 NOTE — Op Note (Signed)
 DATE OF OPERATION: 02/19/2024  LOCATION: [facility] operating Room  PREOPERATIVE DIAGNOSIS: BRCA positive with elevated breast cancer risk  POSTOPERATIVE DIAGNOSIS: Same  PROCEDURE: Placement of bilateral tissue expanders after mastectomy.  SURGEON: Marinell Waddell COME  ASSISTANT: Not applicable  EBL: 25 Cc  CONDITION: Stable  COMPLICATIONS: None  INDICATION: The patient, Shawna Mcbride, is a 59 y.o. female born on [birth year], is here for treatment of elevated risk for breast cancer. After mastectomies she underwent placement of bilateral tissue expanders for first stage of her reconstruction.The chaest was prepped and draped .   PROCEDURE DETAILS:  The patient was seen prior to surgery and marked.  The IV antibiotics were given. The patient was taken to the operating room and given a general anesthetic. A standard time out was performed and all information was confirmed by those in the room. SCDs were placed.   The chest was prepped and draped in usual sterile manner and Dr. Ebbie proceeded with bilateral simple mastectomies.  Once he was finished with the mastectomies I took over care of the patient.  Hemostasis was ensured.  The pockets were prepared.  I selected 300 cc high-profile tissue expanders.  These were prepared on the back table and covered with Flex HD.  219 French round drains were placed in the surgical bed and brought out through separate stab incisions.  The pockets were again inspected no bleeding was noted and they were irrigated.  The chest was prepped and draped with Betadine  dressing reptiles and the entire surgical team changed gloves.  The tissue expander placed in the pockets over the pectoralis muscle and the suture tabs sutured in place with interrupted 3-0 PDS sutures.  Skin edges were tailor tacked in place with skin clips the deep tissues were closed with interrupted 3-0 PDS sutures.  The dermis was closed with interrupted and running 3-0 Monocryl sutures and  the skin was closed with a running 4-0 Monocryl subcuticular stitch on both sides.  The drains were placed to suction.  The old ports were located with magnets and 50 mL of sterile saline was placed in each tissue expander.  The incisions were then sealed with Dermabond.  Dressings and a breast binder were placed.  Patient was awakened from anesthesia without incident transferred to the recovery room in good condition.  All instrument needle and sponge counts were reported as correct and no complications were appreciated during the procedure. The patient was allowed to wake up and taken to recovery room in stable condition at the end of the case. The family was notified at the end of the case.

## 2024-02-20 ENCOUNTER — Inpatient Hospital Stay (HOSPITAL_COMMUNITY): Admitting: Anesthesiology

## 2024-02-20 ENCOUNTER — Ambulatory Visit: Admitting: Student

## 2024-02-20 ENCOUNTER — Encounter (HOSPITAL_COMMUNITY): Admission: AD | Disposition: A | Payer: Self-pay | Source: Ambulatory Visit

## 2024-02-20 ENCOUNTER — Encounter (HOSPITAL_COMMUNITY): Payer: Self-pay

## 2024-02-20 ENCOUNTER — Telehealth: Payer: Self-pay | Admitting: Plastic Surgery

## 2024-02-20 ENCOUNTER — Other Ambulatory Visit: Payer: Self-pay

## 2024-02-20 ENCOUNTER — Encounter: Payer: Self-pay | Admitting: Student

## 2024-02-20 ENCOUNTER — Observation Stay (HOSPITAL_COMMUNITY)
Admission: AD | Admit: 2024-02-20 | Discharge: 2024-02-21 | Disposition: A | Discharge status: Home / Self Care | Source: Ambulatory Visit

## 2024-02-20 VITALS — BP 151/97 | HR 74

## 2024-02-20 DIAGNOSIS — Z1509 Genetic susceptibility to other malignant neoplasm: Secondary | ICD-10-CM

## 2024-02-20 DIAGNOSIS — Z01818 Encounter for other preprocedural examination: Principal | ICD-10-CM

## 2024-02-20 DIAGNOSIS — Z1502 Genetic susceptibility to malignant neoplasm of ovary: Secondary | ICD-10-CM

## 2024-02-20 DIAGNOSIS — J449 Chronic obstructive pulmonary disease, unspecified: Secondary | ICD-10-CM | POA: Insufficient documentation

## 2024-02-20 DIAGNOSIS — L7682 Other postprocedural complications of skin and subcutaneous tissue: Secondary | ICD-10-CM | POA: Insufficient documentation

## 2024-02-20 DIAGNOSIS — S2000XA Contusion of breast, unspecified breast, initial encounter: Secondary | ICD-10-CM | POA: Diagnosis not present

## 2024-02-20 DIAGNOSIS — Z1501 Genetic susceptibility to malignant neoplasm of breast: Secondary | ICD-10-CM

## 2024-02-20 DIAGNOSIS — Z9889 Other specified postprocedural states: Secondary | ICD-10-CM

## 2024-02-20 DIAGNOSIS — N6489 Other specified disorders of breast: Secondary | ICD-10-CM

## 2024-02-20 DIAGNOSIS — Z9013 Acquired absence of bilateral breasts and nipples: Secondary | ICD-10-CM

## 2024-02-20 HISTORY — PX: HEMATOMA EVACUATION: SHX5118

## 2024-02-20 HISTORY — PX: INCISION AND DRAINAGE OF WOUND: SHX1803

## 2024-02-20 LAB — POCT I-STAT, CHEM 8
BUN: 9 mg/dL (ref 6–20)
Calcium, Ion: 1.11 mmol/L — ABNORMAL LOW (ref 1.15–1.40)
Chloride: 104 mmol/L (ref 98–111)
Creatinine, Ser: 0.5 mg/dL (ref 0.44–1.00)
Glucose, Bld: 94 mg/dL (ref 70–99)
HCT: 36 % (ref 36.0–46.0)
Hemoglobin: 12.2 g/dL (ref 12.0–15.0)
Potassium: 3.1 mmol/L — ABNORMAL LOW (ref 3.5–5.1)
Sodium: 142 mmol/L (ref 135–145)
TCO2: 25 mmol/L (ref 22–32)

## 2024-02-20 LAB — SURGICAL PATHOLOGY

## 2024-02-20 MED ORDER — OXYCODONE HCL 5 MG PO TABS: 5.0000 mg | ORAL_TABLET | Freq: Once | ORAL | Status: DC | PRN

## 2024-02-20 MED ORDER — LIDOCAINE 2% (20 MG/ML) 5 ML SYRINGE
INTRAMUSCULAR | Status: AC
  Filled 2024-02-20: qty 5

## 2024-02-20 MED ORDER — CHLORHEXIDINE GLUCONATE 0.12 % MT SOLN
15.0000 mL | Freq: Once | OROMUCOSAL | Status: AC
  Administered 2024-02-20: 15 mL via OROMUCOSAL
  Filled 2024-02-20: qty 15

## 2024-02-20 MED ORDER — CEFAZOLIN SODIUM-DEXTROSE 2-3 GM-%(50ML) IV SOLR
INTRAVENOUS | Status: DC | PRN
  Administered 2024-02-20: 2 g via INTRAVENOUS

## 2024-02-20 MED ORDER — SODIUM CHLORIDE (PF) 0.9 % IJ SOLN: Freq: Once | INTRAMUSCULAR | Status: DC

## 2024-02-20 MED ORDER — ONDANSETRON HCL 4 MG/2ML IJ SOLN
INTRAMUSCULAR | Status: DC | PRN
  Administered 2024-02-20: 4 mg via INTRAVENOUS

## 2024-02-20 MED ORDER — LIDOCAINE-EPINEPHRINE 1 %-1:100000 IJ SOLN
INTRAMUSCULAR | Status: DC | PRN
  Administered 2024-02-20: 20 mL

## 2024-02-20 MED ORDER — MORPHINE SULFATE (PF) 2 MG/ML IV SOLN
2.0000 mg | INTRAVENOUS | Status: DC | PRN
  Administered 2024-02-21: 2 mg via INTRAVENOUS
  Filled 2024-02-20: qty 1

## 2024-02-20 MED ORDER — PROPOFOL 10 MG/ML IV BOLUS
INTRAVENOUS | Status: DC | PRN
  Administered 2024-02-20: 120 mg via INTRAVENOUS

## 2024-02-20 MED ORDER — PROPOFOL 10 MG/ML IV BOLUS
INTRAVENOUS | Status: AC
  Filled 2024-02-20: qty 20

## 2024-02-20 MED ORDER — MEPERIDINE HCL 25 MG/ML IJ SOLN: 6.2500 mg | INTRAMUSCULAR | Status: DC | PRN

## 2024-02-20 MED ORDER — ROCURONIUM BROMIDE 10 MG/ML (PF) SYRINGE
PREFILLED_SYRINGE | INTRAVENOUS | Status: AC
  Filled 2024-02-20: qty 10

## 2024-02-20 MED ORDER — HYDROMORPHONE HCL 1 MG/ML IJ SOLN
0.2500 mg | INTRAMUSCULAR | Status: DC | PRN
  Administered 2024-02-20 (×2): 0.5 mg via INTRAVENOUS

## 2024-02-20 MED ORDER — DEXAMETHASONE SOD PHOSPHATE PF 10 MG/ML IJ SOLN
INTRAMUSCULAR | Status: AC
  Filled 2024-02-20: qty 1

## 2024-02-20 MED ORDER — DEXAMETHASONE SOD PHOSPHATE PF 10 MG/ML IJ SOLN
INTRAMUSCULAR | Status: DC | PRN
  Administered 2024-02-20: 5 mg via INTRAVENOUS

## 2024-02-20 MED ORDER — LACTATED RINGERS IV SOLN: INTRAVENOUS | Status: DC

## 2024-02-20 MED ORDER — CEFAZOLIN SODIUM-DEXTROSE 2-4 GM/100ML-% IV SOLN
2.0000 g | Freq: Three times a day (TID) | INTRAVENOUS | Status: DC
  Administered 2024-02-20 – 2024-02-21 (×3): 2 g via INTRAVENOUS
  Filled 2024-02-20 (×3): qty 100

## 2024-02-20 MED ORDER — ACETAMINOPHEN 500 MG PO TABS
1000.0000 mg | ORAL_TABLET | Freq: Once | ORAL | Status: AC
  Administered 2024-02-20: 1000 mg via ORAL
  Filled 2024-02-20: qty 2

## 2024-02-20 MED ORDER — ACETAMINOPHEN 500 MG PO TABS
1000.0000 mg | ORAL_TABLET | Freq: Four times a day (QID) | ORAL | Status: DC
  Administered 2024-02-20 – 2024-02-21 (×3): 1000 mg via ORAL
  Filled 2024-02-20 (×4): qty 2

## 2024-02-20 MED ORDER — ESCITALOPRAM OXALATE 10 MG PO TABS
20.0000 mg | ORAL_TABLET | Freq: Every day | ORAL | Status: DC
  Administered 2024-02-21: 20 mg via ORAL
  Filled 2024-02-20: qty 2

## 2024-02-20 MED ORDER — FENTANYL CITRATE (PF) 100 MCG/2ML IJ SOLN
INTRAMUSCULAR | Status: AC
  Filled 2024-02-20: qty 2

## 2024-02-20 MED ORDER — HYDROGEN PEROXIDE 3 % EX SOLN
CUTANEOUS | Status: DC | PRN
  Administered 2024-02-20: 1 via TOPICAL

## 2024-02-20 MED ORDER — MIDAZOLAM HCL 2 MG/2ML IJ SOLN
INTRAMUSCULAR | Status: AC
  Filled 2024-02-20: qty 2

## 2024-02-20 MED ORDER — ORAL CARE MOUTH RINSE: 15.0000 mL | Freq: Once | OROMUCOSAL | Status: AC

## 2024-02-20 MED ORDER — HYDROMORPHONE HCL 1 MG/ML IJ SOLN
INTRAMUSCULAR | Status: AC
  Filled 2024-02-20: qty 1

## 2024-02-20 MED ORDER — EPHEDRINE 5 MG/ML INJ
INTRAVENOUS | Status: AC
  Filled 2024-02-20: qty 5

## 2024-02-20 MED ORDER — SUCCINYLCHOLINE CHLORIDE 200 MG/10ML IV SOSY
PREFILLED_SYRINGE | INTRAVENOUS | Status: AC
  Filled 2024-02-20: qty 10

## 2024-02-20 MED ORDER — METHOCARBAMOL 500 MG PO TABS: 500.0000 mg | ORAL_TABLET | Freq: Three times a day (TID) | ORAL | Status: DC | PRN

## 2024-02-20 MED ORDER — VARENICLINE TARTRATE 0.5 MG PO TABS
0.5000 mg | ORAL_TABLET | Freq: Every day | ORAL | Status: DC
  Administered 2024-02-21: 0.5 mg via ORAL
  Filled 2024-02-20: qty 1

## 2024-02-20 MED ORDER — ONDANSETRON HCL 4 MG/2ML IJ SOLN: 4.0000 mg | Freq: Four times a day (QID) | INTRAMUSCULAR | Status: DC | PRN

## 2024-02-20 MED ORDER — SODIUM CHLORIDE 0.9 % IV SOLN
Freq: Once | INTRAVENOUS | Status: AC
  Administered 2024-02-20: 500 mL
  Filled 2024-02-20 (×2): qty 10

## 2024-02-20 MED ORDER — 0.9 % SODIUM CHLORIDE (POUR BTL) OPTIME
TOPICAL | Status: DC | PRN
  Administered 2024-02-20 (×2): 1000 mL

## 2024-02-20 MED ORDER — PANTOPRAZOLE SODIUM 40 MG PO TBEC
40.0000 mg | DELAYED_RELEASE_TABLET | Freq: Two times a day (BID) | ORAL | Status: DC
  Administered 2024-02-21: 40 mg via ORAL
  Filled 2024-02-20: qty 1

## 2024-02-20 MED ORDER — OXYCODONE HCL 5 MG PO TABS
5.0000 mg | ORAL_TABLET | ORAL | Status: DC | PRN
  Administered 2024-02-20 – 2024-02-21 (×2): 5 mg via ORAL
  Filled 2024-02-20 (×2): qty 1

## 2024-02-20 MED ORDER — SIMETHICONE 80 MG PO CHEW: 40.0000 mg | CHEWABLE_TABLET | Freq: Four times a day (QID) | ORAL | Status: DC | PRN

## 2024-02-20 MED ORDER — HEMOSTATIC AGENTS (NO CHARGE) OPTIME
TOPICAL | Status: DC | PRN
  Administered 2024-02-20: 1 via TOPICAL

## 2024-02-20 MED ORDER — CEFAZOLIN SODIUM 1 G IJ SOLR
INTRAMUSCULAR | Status: AC
  Filled 2024-02-20: qty 20

## 2024-02-20 MED ORDER — SUCCINYLCHOLINE CHLORIDE 200 MG/10ML IV SOSY
PREFILLED_SYRINGE | INTRAVENOUS | Status: DC | PRN
  Administered 2024-02-20: 140 mg via INTRAVENOUS

## 2024-02-20 MED ORDER — OXYCODONE HCL 5 MG/5ML PO SOLN: 5.0000 mg | Freq: Once | ORAL | Status: DC | PRN

## 2024-02-20 MED ORDER — FENTANYL CITRATE (PF) 250 MCG/5ML IJ SOLN
INTRAMUSCULAR | Status: DC | PRN
  Administered 2024-02-20 (×2): 50 ug via INTRAVENOUS

## 2024-02-20 MED ORDER — MIDAZOLAM HCL (PF) 2 MG/2ML IJ SOLN: 0.5000 mg | Freq: Once | INTRAMUSCULAR | Status: DC | PRN

## 2024-02-20 MED ORDER — SCOPOLAMINE 1 MG/3DAYS TD PT72
1.0000 | MEDICATED_PATCH | TRANSDERMAL | Status: DC
  Administered 2024-02-20: 1 mg via TRANSDERMAL
  Filled 2024-02-20: qty 1

## 2024-02-20 MED ORDER — HYDROXYZINE HCL 50 MG PO TABS: 50.0000 mg | ORAL_TABLET | Freq: Three times a day (TID) | ORAL | Status: DC | PRN

## 2024-02-20 MED ORDER — HYDROMORPHONE HCL 1 MG/ML IJ SOLN
INTRAMUSCULAR | Status: DC | PRN
  Administered 2024-02-20: .5 mg via INTRAVENOUS

## 2024-02-20 MED ORDER — ONDANSETRON 4 MG PO TBDP: 4.0000 mg | ORAL_TABLET | Freq: Four times a day (QID) | ORAL | Status: DC | PRN

## 2024-02-20 MED ORDER — LIDOCAINE 2% (20 MG/ML) 5 ML SYRINGE
INTRAMUSCULAR | Status: DC | PRN
  Administered 2024-02-20: 20 mg via INTRAVENOUS

## 2024-02-20 MED ORDER — BUPIVACAINE HCL (PF) 0.25 % IJ SOLN
INTRAMUSCULAR | Status: AC
  Filled 2024-02-20: qty 20

## 2024-02-20 MED ORDER — METHOCARBAMOL 1000 MG/10ML IJ SOLN: 500.0000 mg | Freq: Three times a day (TID) | INTRAMUSCULAR | Status: DC | PRN

## 2024-02-20 MED ORDER — SODIUM CHLORIDE 0.9 % IV SOLN: INTRAVENOUS | Status: DC

## 2024-02-20 MED ORDER — VASHE WOUND IRRIGATION OPTIME
TOPICAL | Status: DC | PRN
  Administered 2024-02-20 (×2): 34 [oz_av]

## 2024-02-20 MED ORDER — HYDROMORPHONE HCL 1 MG/ML IJ SOLN
INTRAMUSCULAR | Status: AC
  Filled 2024-02-20: qty 0.5

## 2024-02-20 MED ORDER — LIDOCAINE-EPINEPHRINE 1 %-1:100000 IJ SOLN
INTRAMUSCULAR | Status: AC
  Filled 2024-02-20: qty 1

## 2024-02-20 MED ORDER — PHENYLEPHRINE 80 MCG/ML (10ML) SYRINGE FOR IV PUSH (FOR BLOOD PRESSURE SUPPORT)
PREFILLED_SYRINGE | INTRAVENOUS | Status: AC
  Filled 2024-02-20: qty 10

## 2024-02-20 MED ORDER — EPHEDRINE SULFATE-NACL 50-0.9 MG/10ML-% IV SOSY
PREFILLED_SYRINGE | INTRAVENOUS | Status: DC | PRN
  Administered 2024-02-20: 5 mg via INTRAVENOUS

## 2024-02-20 MED ORDER — ONDANSETRON HCL 4 MG/2ML IJ SOLN
INTRAMUSCULAR | Status: AC
  Filled 2024-02-20: qty 2

## 2024-02-20 MED ORDER — MIDAZOLAM HCL (PF) 2 MG/2ML IJ SOLN
INTRAMUSCULAR | Status: DC | PRN
  Administered 2024-02-20: 2 mg via INTRAVENOUS

## 2024-02-20 NOTE — Anesthesia Procedure Notes (Signed)
 Procedure Name: Intubation Date/Time: 02/20/2024 2:56 PM  Performed by: Kearney Rosina SAILOR, RNPre-anesthesia Checklist: Patient identified, Emergency Drugs available, Suction available and Patient being monitored Patient Re-evaluated:Patient Re-evaluated prior to induction Oxygen Delivery Method: Circle system utilized Preoxygenation: Pre-oxygenation with 100% oxygen Induction Type: IV induction, Rapid sequence and Cricoid Pressure applied Laryngoscope Size: Miller and 2 Grade View: Grade I Tube type: Oral Number of attempts: 1 Airway Equipment and Method: Stylet and Oral airway Placement Confirmation: ETT inserted through vocal cords under direct vision, positive ETCO2 and breath sounds checked- equal and bilateral Secured at: 23 cm Tube secured with: Tape Dental Injury: Teeth and Oropharynx as per pre-operative assessment  Comments: Atraumatic placement, RSI, masking not attempted.

## 2024-02-20 NOTE — Progress Notes (Addendum)
 Patient ID: Shawna Mcbride, female   DOB: Aug 08, 1965, 59 y.o.   MRN: [id number] Patient seen with right breast hematoma after I discharged this morning. Plan to return to OR now Appreciate Dr Eustacio assistance

## 2024-02-20 NOTE — Telephone Encounter (Signed)
 Pt just called and asked if all her meds and her antibiotics have been ordered for her? Clinical please advise her

## 2024-02-20 NOTE — Anesthesia Postprocedure Evaluation (Signed)
"   Anesthesia Post Note  Patient: Shawna Mcbride  Procedure(s) Performed: EVACUATION HEMATOMA (Right: Breast) IRRIGATION AND DEBRIDEMENT (Right: Breast)     Patient location during evaluation: PACU Anesthesia Type: General Level of consciousness: awake and alert, oriented and patient cooperative Pain management: pain level controlled Vital Signs Assessment: post-procedure vital signs reviewed and stable Respiratory status: spontaneous breathing, nonlabored ventilation and respiratory function stable Cardiovascular status: blood pressure returned to baseline and stable Postop Assessment: no apparent nausea or vomiting Anesthetic complications: no   There were no known notable events for this encounter.  Last Vitals:  Vitals:   02/20/24 1745 02/20/24 1806  BP: (!) 162/73 131/67  Pulse: 74 73  Resp: 18 16  Temp: 36.8 C 36.6 C  SpO2: 93% 94%    Last Pain:  Vitals:   02/20/24 1839  TempSrc:   PainSc: 2                  Caius Silbernagel,E. Dru Primeau      "

## 2024-02-20 NOTE — Op Note (Signed)
 Operative Note   DATE OF OPERATION: 02/20/2024  LOCATION: [facility] Main OR  SURGICAL DEPARTMENT: Plastic Surgery  PREOPERATIVE DIAGNOSES:  Right breast hematoma  POSTOPERATIVE DIAGNOSES:  same  PROCEDURE:   Evacuation of breast hematoma Exploration of breast pocket, washout and debridement.  SURGEON: Nieves Client MD  ASSISTANT: Estefana Peck PA   ANESTHESIA:  General.   COMPLICATIONS: None.   INDICATIONS FOR PROCEDURE:  The patient, Shawna Mcbride is a 59 y.o. female born on 1965-06-08, is here for treatment of right breast hematoma MRN: [id number]  CONSENT:  Informed consent was obtained directly from the patient. Risks, benefits and alternatives were fully discussed. Specific risks including but not limited to bleeding, infection, hematoma, seroma, scarring, pain, infection, contracture, asymmetry, wound healing problems, and need for further surgery were all discussed. The patient did have an ample opportunity to have questions answered to satisfaction.   DESCRIPTION OF PROCEDURE:  The patient was taken to the operating room. SCDs were placed and IV antibiotics were given. The patient's operative site was prepped and draped in a sterile fashion. A time out was performed and all information was confirmed to be correct.  General anesthesia was administered. A surgical timeout was performed.  The right breast prior incision was reopened sharply and carried down through subcutaneous tissue to the tissue expander pocket. Upon entering the pocket, a hematoma was encountered and evacuated using suction and manual extraction. Approximately 500 mL of clot and blood were removed.  The tissue expander was removed and placed on Vashe. The pocket was thoroughly irrigated with copious amounts of diluted hydrogene peroxide, Vashe and antibiotic-containing saline. Meticulous hemostasis was achieved using electrocautery, hemoblast and suture ligation of two pectoralis muscle arteries that were  actively bleeding. A valsalva maneuver was performed. No active bleeding was identified after careful inspection of the pocket, chest wall, and surrounding tissues.  The tissue expander was inspected and found to be intact and viable. The expander was returned to the pocket in proper position and fixed to the chest wall with 2-0 PDS. It was deflated. Debridement of the wound edges was performed with sharp scissors. A new 15 French Channeled Jackson-Pratt drain was placed through a separate stab incision and secured with a nylon suture.  The incision was closed in layers using 0-Vycril for the deep layer, 3-0 PDS for the dermis, and 3-0 Monocryl for the skin. Sterile dressings were applied.  The patient tolerated the procedure well.  There were no complications. The patient was allowed to wake from anesthesia, extubated and taken to the recovery room in satisfactory condition.   The advanced practice practitioner (APP) assisted throughout the case.  The APP was essential in retraction and counter traction when needed to make the case progress smoothly.  This retraction and assistance made it possible to see the tissue plans for the procedure.  The assistance was needed for blood control, tissue re-approximation and assisted with closure of the incision site.   Scout Guyett M. Ellsie Violette, MD [facility]

## 2024-02-20 NOTE — Progress Notes (Signed)
 Patient called. I called her back. She states that she has had 60cc out from one drain since being discharged from the hospital. She reports that side does appear to be a little bit more swollen. She denies any light-headedness or dizziness. I recommended that she be evaluated in our clinic today. Discuss with her to continue compression at all times. I discussed with her if she develops any light-headedness or dizziness she needs to go to [facility] emergency room. She expressed understanding.

## 2024-02-20 NOTE — Discharge Summary (Signed)
 Physician Discharge Summary  Patient ID: Shawna Mcbride MRN: [id number] DOB/AGE: [birth year] 58 y.o.  Admit date: 02/19/2024 Discharge date: 02/20/2024  Admission Diagnoses: Brca mutation  Discharge Diagnoses:  Principal Problem:   S/P bilateral mastectomy   Discharged Condition: good  Hospital Course: 54 yof underwent bilateral mastectomies with expander reconstruction for brca mutation. Doing well following am ready for dc   Consults: None  Significant Diagnostic Studies: none  Treatments: surgery: bilateral ssm, expanders  Discharge Exam: Blood pressure 135/80, pulse 61, temperature 98.1 F (36.7 C), resp. rate 16, height 5' 7 (1.702 m), weight 56.1 kg, SpO2 98%. Flaps viable, drains as expected  Disposition: Discharge disposition: 01-Home or Self Care        Allergies as of 02/20/2024       Reactions   Nsaids Other (See Comments)   Ulcers, takes goody or BC   Sulfa Antibiotics Hives   Ferrlecit [na Ferric Gluc Cplx In Sucrose] Hives, Swelling        Medication List     TAKE these medications    BLACK COHOSH PO Take 2 tablets by mouth daily at 12 noon.   cholecalciferol 25 MCG (1000 UNIT) tablet Commonly known as: VITAMIN D3 Take 1,000 Units by mouth daily.   EPINEPHrine  0.3 mg/0.3 mL Soaj injection Commonly known as: EPI-PEN Inject 0.3 mg into the muscle as needed for anaphylaxis.   escitalopram  20 MG tablet Commonly known as: LEXAPRO  TAKE 1 TABLET BY MOUTH EVERY DAY   hydrOXYzine  50 MG capsule Commonly known as: VISTARIL  Take 1 capsule (50 mg total) by mouth every 8 (eight) hours as needed.   Multivitamin Women 50+ Tabs Take 1 tablet by mouth daily.   Osteo Bi-Flex Triple Strength Tabs Take 0.5 tablets by mouth daily at 12 noon.   pantoprazole  40 MG tablet Commonly known as: PROTONIX  Take 1 tablet (40 mg total) by mouth 2 (two) times daily before a meal.   varenicline  0.5 MG tablet Commonly known as: CHANTIX  TAKE 1 TABLET BY MOUTH  EVERY DAY   vitamin E 180 MG (400 UNITS) capsule Take 400 Units by mouth daily at 12 noon.        Follow-up Information     Ebbie Cough, MD Follow up in 3 week(s).   Specialty: General Surgery Contact information: [address] [place] [address] [phone]                 Signed: Cough Ebbie 02/20/2024, 7:28 AM

## 2024-02-20 NOTE — Transfer of Care (Signed)
 Immediate Anesthesia Transfer of Care Note  Patient: Shawna Mcbride  Procedure(s) Performed: EVACUATION HEMATOMA (Right: Breast) IRRIGATION AND DEBRIDEMENT (Right: Breast)  Patient Location: PACU  Anesthesia Type:General  Level of Consciousness: awake, alert , and oriented  Airway & Oxygen Therapy: Patient Spontanous Breathing and Patient connected to face mask oxygen  Post-op Assessment: Report given to RN, Post -op Vital signs reviewed and stable, and Patient moving all extremities  Post vital signs: Reviewed and stable  Last Vitals:  Vitals Value Taken Time  BP 114/89 02/20/24 16:31  Temp    Pulse 82 02/20/24 16:33  Resp 19 02/20/24 16:33  SpO2 93 % 02/20/24 16:33  Vitals shown include unfiled device data.  Last Pain:  Vitals:   02/20/24 1416  TempSrc: Oral  PainSc:          Complications: There were no known notable events for this encounter.

## 2024-02-20 NOTE — Progress Notes (Signed)
 Patient is a 59 year old female who underwent bilateral mastectomies with Dr. Ebbie followed by placement of bilateral tissue expanders with Dr. Waddell yesterday.  She presents to the clinic today with increased drain output and swelling to her breast.  Patient was discharged from the hospital this morning.  She called at approximately 10 AM this morning and stated that she had already put out about 60 to 70 cc of drainage from her right drain.  She also reported that her breast was swollen.  It was recommended that she come into the clinic to be evaluated.  Patient presents to the clinic with her husband and her son at bedside.  Since speaking with the patient on the phone this morning, she had another approximately 30 cc out in her drain.  She reports that the right side is still more swollen and painful than the left side.  She also reported that some bruising had developed over the right side.  She denies any lightheadedness or dizziness.  She denies any chest pain or shortness of breath.  She denies any issues with the left side.  She reports that the left JP drain put out approximately 30 cc in total since being discharged from the hospital.  She denies any other issues or concerns.  Vitals:   02/20/24 1250  BP: (!) 151/97  Pulse: 74  SpO2: 99%  Vitals are stable, blood pressure is elevated  Chaperone present on exam.  On exam, patient is sitting upright in no acute distress.  Right breast is significantly larger than the left breast.  Expanders are in place bilaterally.  There is ecchymosis noted over the right breast.  There is no overlying erythema to either breast.  There is some mild tenderness to palpation to the right breast.  Incisions appear to be clean dry and intact.  There is minimal serosanguineous drainage in the left JP drain.  There is bloody drainage in the right JP drain.  Discussed with the patient that she would most likely need to go back to the operating room as it  appears she has a hematoma.  I called and discussed this with Dr. Montorfano as Dr. Waddell is out of town.  Plan was for patient to go to the OR for evacuation of her hematoma.  OR was called ahead and surgery was scheduled.  Patient to go directly to the OR.  Her son and her husband will take her over.  Pictures were obtained of the patient and placed in the chart with the patient's or guardian's permission.

## 2024-02-20 NOTE — Interval H&P Note (Signed)
 History and Physical Interval Note:  02/20/2024 2:43 PM  Shawna Mcbride  has presented today for surgery, with the diagnosis of Bleeding.  The various methods of treatment have been discussed with the patient and family. After consideration of risks, benefits and other options for treatment, the patient has consented to  Procedures with comments: EVACUATION HEMATOMA (Right) - EVACUATION OF RIGHT BREAST HEMATOMA as a surgical intervention.  The patient's history has been reviewed, patient examined, no change in status, ok to proceed with surgery.  I have reviewed the patient's chart and labs.  Questions were answered to the patient's satisfaction.     Ewing Fandino M Mcihael Hinderman

## 2024-02-20 NOTE — Discharge Instructions (Signed)
 About my Jackson-Pratt Bulb Drain  What is a Jackson-Pratt bulb? A Jackson-Pratt is a soft, round device used to collect drainage. It is connected to a long, thin drainage catheter, which is held in place by one or two small stiches near your surgical incision site. When the bulb is squeezed, it forms a vacuum, forcing the drainage to empty into the bulb.  Emptying the Jackson-Pratt bulb- To empty the bulb: 1. Release the plug on the top of the bulb. 2. Pour the bulb's contents into a measuring container which your nurse will provide. 3. Record the time emptied and amount of drainage. Empty the drain(s) as often as your     doctor or nurse recommends.  Date                  Time                    Amount (Drain 1)                 Amount (Drain 2)  _____________________________________________________________________  _____________________________________________________________________  _____________________________________________________________________  _____________________________________________________________________  _____________________________________________________________________  _____________________________________________________________________  _____________________________________________________________________  _____________________________________________________________________  Squeezing the Jackson-Pratt Bulb- To squeeze the bulb: 1. Make sure the plug at the top of the bulb is open. 2. Squeeze the bulb tightly in your fist. You will hear air squeezing from the bulb. 3. Replace the plug while the bulb is squeezed. 4. Use a safety pin to attach the bulb to your clothing. This will keep the catheter from     pulling at the bulb insertion site.  When to call your doctor- Call your doctor if:  Drain site becomes red, swollen or hot.  You have a fever greater than 101 degrees F.  There is oozing at the drain site.  Drain falls out (apply a guaze  bandage over the drain hole and secure it with tape).  Drainage increases daily not related to activity patterns. (You will usually have more drainage when you are active than when you are resting.)  Drainage has a bad odor.

## 2024-02-20 NOTE — H&P (View-Only) (Signed)
 Patient called. I called her back. She states that she has had 60cc out from one drain since being discharged from the hospital. She reports that side does appear to be a little bit more swollen. She denies any light-headedness or dizziness. I recommended that she be evaluated in our clinic today. Discuss with her to continue compression at all times. I discussed with her if she develops any light-headedness or dizziness she needs to go to [facility] emergency room. She expressed understanding.

## 2024-02-20 NOTE — Anesthesia Preprocedure Evaluation (Addendum)
"                                    Anesthesia Evaluation  Patient identified by MRN, date of birth, ID band Patient awake    Reviewed: Allergy & Precautions, NPO status , Patient's Chart, lab work & pertinent test results  History of Anesthesia Complications Negative for: history of anesthetic complications  Airway Mallampati: II  TM Distance: >3 FB Neck ROM: Full    Dental  (+) Edentulous Upper, Missing, Dental Advisory Given, Poor Dentition   Pulmonary COPD, Current SmokerPatient did not abstain from smoking.   breath sounds clear to auscultation       Cardiovascular negative cardio ROS  Rhythm:Regular Rate:Normal     Neuro/Psych   Anxiety Depression    negative neurological ROS     GI/Hepatic Neg liver ROS,GERD  Medicated and Controlled,,  Endo/Other  negative endocrine ROS    Renal/GU negative Renal ROS     Musculoskeletal   Abdominal   Peds  Hematology Hb 12.2   Anesthesia Other Findings   Reproductive/Obstetrics                              Anesthesia Physical Anesthesia Plan  ASA: 2  Anesthesia Plan: General   Post-op Pain Management: Ofirmev  IV (intra-op)*   Induction: Intravenous and Rapid sequence  PONV Risk Score and Plan: 3 and Ondansetron , Dexamethasone  and Scopolamine  patch - Pre-op  Airway Management Planned: Oral ETT  Additional Equipment: None  Intra-op Plan:   Post-operative Plan: Extubation in OR  Informed Consent: I have reviewed the patients History and Physical, chart, labs and discussed the procedure including the risks, benefits and alternatives for the proposed anesthesia with the patient or authorized representative who has indicated his/her understanding and acceptance.     Dental advisory given  Plan Discussed with: CRNA and Surgeon  Anesthesia Plan Comments:          Anesthesia Quick Evaluation  "

## 2024-02-21 ENCOUNTER — Ambulatory Visit: Payer: Self-pay | Admitting: Family

## 2024-02-21 ENCOUNTER — Other Ambulatory Visit: Payer: Self-pay | Admitting: Plastic Surgery

## 2024-02-21 LAB — BASIC METABOLIC PANEL WITH GFR
Anion gap: 12 (ref 5–15)
BUN: 7 mg/dL (ref 6–20)
CO2: 24 mmol/L (ref 22–32)
Calcium: 8.8 mg/dL — ABNORMAL LOW (ref 8.9–10.3)
Chloride: 102 mmol/L (ref 98–111)
Creatinine, Ser: 0.52 mg/dL (ref 0.44–1.00)
GFR, Estimated: >60 mL/min
Glucose, Bld: 98 mg/dL (ref 70–99)
Potassium: 3.5 mmol/L (ref 3.5–5.1)
Sodium: 138 mmol/L (ref 135–145)

## 2024-02-21 LAB — CBC
HCT: 28.2 % — ABNORMAL LOW (ref 36.0–46.0)
Hemoglobin: 9.5 g/dL — ABNORMAL LOW (ref 12.0–15.0)
MCH: 31.5 pg (ref 26.0–34.0)
MCHC: 33.7 g/dL (ref 30.0–36.0)
MCV: 93.4 fL (ref 80.0–100.0)
Platelets: 225 K/uL (ref 150–400)
RBC: 3.02 MIL/uL — ABNORMAL LOW (ref 3.87–5.11)
RDW: 13.1 % (ref 11.5–15.5)
WBC: 9 K/uL (ref 4.0–10.5)
nRBC: 0 % (ref 0.0–0.2)

## 2024-02-21 MED ORDER — DOXYCYCLINE HYCLATE 100 MG PO CAPS: 100.0000 mg | ORAL_CAPSULE | Freq: Two times a day (BID) | ORAL | 0 refills | Status: AC

## 2024-02-21 MED ORDER — OXYCODONE HCL 5 MG PO CAPS: 5.0000 mg | ORAL_CAPSULE | Freq: Four times a day (QID) | ORAL | 0 refills | Status: AC | PRN

## 2024-02-21 NOTE — Care Management (Signed)
 Transition of Care [facility]) - Inpatient Brief Assessment   Patient Details  Name: Shawna Mcbride MRN: [id number] Date of Birth: 06/23/1965  Transition of Care [facility]) CM/SW Contact:    Corean JAYSON Canary, RN Phone Number: 02/21/2024, 10:22 AM   Clinical Narrative: Had Bil mastectomies January 13, drain was placed at that time, now with hematoma that has been evaluated. No needs identified, if a need is identified, please place a TOC consult   Transition of Care Asessment: Insurance and Status: Insurance coverage has been reviewed Patient has primary care physician: Yes Home environment has been reviewed: [facility] with spouse Prior level of function:: Independent Prior/Current Home Services: No current home services Social Drivers of Health Review: SDOH reviewed no interventions necessary Readmission risk has been reviewed: Yes Transition of care needs: no transition of care needs at this time

## 2024-02-21 NOTE — Plan of Care (Signed)
 " Problem: Education: Goal: Knowledge of General Education information will improve Description: Including pain rating scale, medication(s)/side effects and non-pharmacologic comfort measures 02/21/2024 1420 by Burnard Almarie BROCKS, RN Outcome: Adequate for Discharge 02/21/2024 1419 by Burnard Almarie BROCKS, RN Outcome: Adequate for Discharge 02/21/2024 0801 by Burnard Almarie BROCKS, RN Outcome: Progressing   Problem: Health Behavior/Discharge Planning: Goal: Ability to manage health-related needs will improve 02/21/2024 1420 by Burnard Almarie BROCKS, RN Outcome: Adequate for Discharge 02/21/2024 1419 by Burnard Almarie BROCKS, RN Outcome: Adequate for Discharge 02/21/2024 0801 by Burnard Almarie BROCKS, RN Outcome: Progressing   Problem: Clinical Measurements: Goal: Ability to maintain clinical measurements within normal limits will improve 02/21/2024 1420 by Burnard Almarie BROCKS, RN Outcome: Adequate for Discharge 02/21/2024 1419 by Burnard Almarie BROCKS, RN Outcome: Adequate for Discharge 02/21/2024 0801 by Burnard Almarie BROCKS, RN Outcome: Progressing Goal: Will remain free from infection 02/21/2024 1420 by Burnard Almarie BROCKS, RN Outcome: Adequate for Discharge 02/21/2024 1419 by Burnard Almarie BROCKS, RN Outcome: Adequate for Discharge 02/21/2024 0801 by Burnard Almarie BROCKS, RN Outcome: Progressing Goal: Diagnostic test results will improve 02/21/2024 1420 by Burnard Almarie BROCKS, RN Outcome: Adequate for Discharge 02/21/2024 1419 by Burnard Almarie BROCKS, RN Outcome: Adequate for Discharge 02/21/2024 0801 by Burnard Almarie BROCKS, RN Outcome: Progressing Goal: Respiratory complications will improve 02/21/2024 1420 by Burnard Almarie BROCKS, RN Outcome: Adequate for Discharge 02/21/2024 1419 by Burnard Almarie BROCKS, RN Outcome: Adequate for Discharge 02/21/2024 0801 by Burnard Almarie BROCKS, RN Outcome: Progressing Goal: Cardiovascular complication will be avoided 02/21/2024 1420 by Burnard Almarie BROCKS, RN Outcome: Adequate for  Discharge 02/21/2024 1419 by Burnard Almarie BROCKS, RN Outcome: Adequate for Discharge 02/21/2024 0801 by Burnard Almarie BROCKS, RN Outcome: Progressing   Problem: Activity: Goal: Risk for activity intolerance will decrease 02/21/2024 1420 by Burnard Almarie BROCKS, RN Outcome: Adequate for Discharge 02/21/2024 1419 by Burnard Almarie BROCKS, RN Outcome: Adequate for Discharge 02/21/2024 0801 by Burnard Almarie BROCKS, RN Outcome: Progressing   Problem: Nutrition: Goal: Adequate nutrition will be maintained 02/21/2024 1420 by Burnard Almarie BROCKS, RN Outcome: Adequate for Discharge 02/21/2024 1419 by Burnard Almarie BROCKS, RN Outcome: Adequate for Discharge 02/21/2024 0801 by Burnard Almarie BROCKS, RN Outcome: Progressing   Problem: Coping: Goal: Level of anxiety will decrease 02/21/2024 1420 by Burnard Almarie BROCKS, RN Outcome: Adequate for Discharge 02/21/2024 1419 by Burnard Almarie BROCKS, RN Outcome: Adequate for Discharge 02/21/2024 0801 by Burnard Almarie BROCKS, RN Outcome: Progressing   Problem: Elimination: Goal: Will not experience complications related to bowel motility 02/21/2024 1420 by Burnard Almarie BROCKS, RN Outcome: Adequate for Discharge 02/21/2024 1419 by Burnard Almarie BROCKS, RN Outcome: Adequate for Discharge 02/21/2024 0801 by Burnard Almarie BROCKS, RN Outcome: Progressing Goal: Will not experience complications related to urinary retention 02/21/2024 1420 by Burnard Almarie BROCKS, RN Outcome: Adequate for Discharge 02/21/2024 1419 by Burnard Almarie BROCKS, RN Outcome: Adequate for Discharge 02/21/2024 0801 by Burnard Almarie BROCKS, RN Outcome: Progressing   Problem: Pain Managment: Goal: General experience of comfort will improve and/or be controlled 02/21/2024 1420 by Burnard Almarie BROCKS, RN Outcome: Adequate for Discharge 02/21/2024 1419 by Burnard Almarie BROCKS, RN Outcome: Adequate for Discharge 02/21/2024 0801 by Burnard Almarie BROCKS, RN Outcome: Progressing   Problem: Safety: Goal: Ability to remain free  from injury will improve 02/21/2024 1420 by Burnard Almarie BROCKS, RN Outcome: Adequate for Discharge 02/21/2024 1419 by Burnard Almarie BROCKS, RN Outcome: Adequate for Discharge 02/21/2024 0801 by Burnard Almarie BROCKS, RN Outcome: Progressing   Problem: Skin Integrity: Goal: Risk for  impaired skin integrity will decrease 02/21/2024 1420 by Burnard Almarie BROCKS, RN Outcome: Adequate for Discharge 02/21/2024 1419 by Burnard Almarie BROCKS, RN Outcome: Adequate for Discharge 02/21/2024 0801 by Burnard Almarie BROCKS, RN Outcome: Progressing   Problem: Education: Goal: Knowledge of the prescribed therapeutic regimen will improve 02/21/2024 1420 by Burnard Almarie BROCKS, RN Outcome: Adequate for Discharge 02/21/2024 1419 by Burnard Almarie BROCKS, RN Outcome: Adequate for Discharge 02/21/2024 0801 by Burnard Almarie BROCKS, RN Outcome: Progressing   Problem: Bowel/Gastric: Goal: Gastrointestinal status for postoperative course will improve 02/21/2024 1420 by Burnard Almarie BROCKS, RN Outcome: Adequate for Discharge 02/21/2024 1419 by Burnard Almarie BROCKS, RN Outcome: Adequate for Discharge 02/21/2024 0801 by Burnard Almarie BROCKS, RN Outcome: Progressing   Problem: Cardiac: Goal: Ability to maintain an adequate cardiac output 02/21/2024 1420 by Burnard Almarie BROCKS, RN Outcome: Adequate for Discharge 02/21/2024 1419 by Burnard Almarie BROCKS, RN Outcome: Adequate for Discharge 02/21/2024 0801 by Burnard Almarie BROCKS, RN Outcome: Progressing Goal: Will show no evidence of cardiac arrhythmias 02/21/2024 1420 by Burnard Almarie BROCKS, RN Outcome: Adequate for Discharge 02/21/2024 1419 by Burnard Almarie BROCKS, RN Outcome: Adequate for Discharge 02/21/2024 0801 by Burnard Almarie BROCKS, RN Outcome: Progressing   Problem: Nutritional: Goal: Will attain and maintain optimal nutritional status 02/21/2024 1420 by Burnard Almarie BROCKS, RN Outcome: Adequate for Discharge 02/21/2024 1419 by Burnard Almarie BROCKS, RN Outcome: Adequate for  Discharge 02/21/2024 0801 by Burnard Almarie BROCKS, RN Outcome: Progressing   Problem: Neurological: Goal: Will regain or maintain usual level of consciousness 02/21/2024 1420 by Burnard Almarie BROCKS, RN Outcome: Adequate for Discharge 02/21/2024 1419 by Burnard Almarie BROCKS, RN Outcome: Adequate for Discharge 02/21/2024 0801 by Burnard Almarie BROCKS, RN Outcome: Progressing   Problem: Clinical Measurements: Goal: Ability to maintain clinical measurements within normal limits 02/21/2024 1420 by Burnard Almarie BROCKS, RN Outcome: Adequate for Discharge 02/21/2024 1419 by Burnard Almarie BROCKS, RN Outcome: Adequate for Discharge 02/21/2024 0801 by Burnard Almarie BROCKS, RN Outcome: Progressing Goal: Postoperative complications will be avoided or minimized 02/21/2024 1420 by Burnard Almarie BROCKS, RN Outcome: Adequate for Discharge 02/21/2024 1419 by Burnard Almarie BROCKS, RN Outcome: Adequate for Discharge 02/21/2024 0801 by Burnard Almarie BROCKS, RN Outcome: Progressing   Problem: Respiratory: Goal: Will regain and/or maintain adequate ventilation 02/21/2024 1420 by Burnard Almarie BROCKS, RN Outcome: Adequate for Discharge 02/21/2024 1419 by Burnard Almarie BROCKS, RN Outcome: Adequate for Discharge 02/21/2024 0801 by Burnard Almarie BROCKS, RN Outcome: Progressing Goal: Respiratory status will improve 02/21/2024 1420 by Burnard Almarie BROCKS, RN Outcome: Adequate for Discharge 02/21/2024 1419 by Burnard Almarie BROCKS, RN Outcome: Adequate for Discharge 02/21/2024 0801 by Burnard Almarie BROCKS, RN Outcome: Progressing   Problem: Skin Integrity: Goal: Demonstrates signs of wound healing without infection 02/21/2024 1420 by Burnard Almarie BROCKS, RN Outcome: Adequate for Discharge 02/21/2024 1419 by Burnard Almarie BROCKS, RN Outcome: Adequate for Discharge 02/21/2024 0801 by Burnard Almarie BROCKS, RN Outcome: Progressing   Problem: Urinary Elimination: Goal: Will remain free from infection 02/21/2024 1420 by Burnard Almarie BROCKS,  RN Outcome: Adequate for Discharge 02/21/2024 1419 by Burnard Almarie BROCKS, RN Outcome: Adequate for Discharge 02/21/2024 0801 by Burnard Almarie BROCKS, RN Outcome: Progressing Goal: Ability to achieve and maintain adequate urine output 02/21/2024 1420 by Burnard Almarie BROCKS, RN Outcome: Adequate for Discharge 02/21/2024 1419 by Burnard Almarie BROCKS, RN Outcome: Adequate for Discharge 02/21/2024 0801 by Burnard Almarie BROCKS, RN Outcome: Progressing   "

## 2024-02-21 NOTE — Plan of Care (Signed)
 " Problem: Education: Goal: Knowledge of General Education information will improve Description: Including pain rating scale, medication(s)/side effects and non-pharmacologic comfort measures 02/21/2024 1419 by Burnard Almarie BROCKS, RN Outcome: Adequate for Discharge 02/21/2024 0801 by Burnard Almarie BROCKS, RN Outcome: Progressing   Problem: Health Behavior/Discharge Planning: Goal: Ability to manage health-related needs will improve 02/21/2024 1419 by Burnard Almarie BROCKS, RN Outcome: Adequate for Discharge 02/21/2024 0801 by Burnard Almarie BROCKS, RN Outcome: Progressing   Problem: Clinical Measurements: Goal: Ability to maintain clinical measurements within normal limits will improve 02/21/2024 1419 by Burnard Almarie BROCKS, RN Outcome: Adequate for Discharge 02/21/2024 0801 by Burnard Almarie BROCKS, RN Outcome: Progressing Goal: Will remain free from infection 02/21/2024 1419 by Burnard Almarie BROCKS, RN Outcome: Adequate for Discharge 02/21/2024 0801 by Burnard Almarie BROCKS, RN Outcome: Progressing Goal: Diagnostic test results will improve 02/21/2024 1419 by Burnard Almarie BROCKS, RN Outcome: Adequate for Discharge 02/21/2024 0801 by Burnard Almarie BROCKS, RN Outcome: Progressing Goal: Respiratory complications will improve 02/21/2024 1419 by Burnard Almarie BROCKS, RN Outcome: Adequate for Discharge 02/21/2024 0801 by Burnard Almarie BROCKS, RN Outcome: Progressing Goal: Cardiovascular complication will be avoided 02/21/2024 1419 by Burnard Almarie BROCKS, RN Outcome: Adequate for Discharge 02/21/2024 0801 by Burnard Almarie BROCKS, RN Outcome: Progressing   Problem: Activity: Goal: Risk for activity intolerance will decrease 02/21/2024 1419 by Burnard Almarie BROCKS, RN Outcome: Adequate for Discharge 02/21/2024 0801 by Burnard Almarie BROCKS, RN Outcome: Progressing   Problem: Nutrition: Goal: Adequate nutrition will be maintained 02/21/2024 1419 by Burnard Almarie BROCKS, RN Outcome: Adequate for Discharge 02/21/2024 0801 by  Burnard Almarie BROCKS, RN Outcome: Progressing   Problem: Coping: Goal: Level of anxiety will decrease 02/21/2024 1419 by Burnard Almarie BROCKS, RN Outcome: Adequate for Discharge 02/21/2024 0801 by Burnard Almarie BROCKS, RN Outcome: Progressing   Problem: Elimination: Goal: Will not experience complications related to bowel motility 02/21/2024 1419 by Burnard Almarie BROCKS, RN Outcome: Adequate for Discharge 02/21/2024 0801 by Burnard Almarie BROCKS, RN Outcome: Progressing Goal: Will not experience complications related to urinary retention 02/21/2024 1419 by Burnard Almarie BROCKS, RN Outcome: Adequate for Discharge 02/21/2024 0801 by Burnard Almarie BROCKS, RN Outcome: Progressing   Problem: Pain Managment: Goal: General experience of comfort will improve and/or be controlled 02/21/2024 1419 by Burnard Almarie BROCKS, RN Outcome: Adequate for Discharge 02/21/2024 0801 by Burnard Almarie BROCKS, RN Outcome: Progressing   Problem: Safety: Goal: Ability to remain free from injury will improve 02/21/2024 1419 by Burnard Almarie BROCKS, RN Outcome: Adequate for Discharge 02/21/2024 0801 by Burnard Almarie BROCKS, RN Outcome: Progressing   Problem: Skin Integrity: Goal: Risk for impaired skin integrity will decrease 02/21/2024 1419 by Burnard Almarie BROCKS, RN Outcome: Adequate for Discharge 02/21/2024 0801 by Burnard Almarie BROCKS, RN Outcome: Progressing   Problem: Education: Goal: Knowledge of the prescribed therapeutic regimen will improve 02/21/2024 1419 by Burnard Almarie BROCKS, RN Outcome: Adequate for Discharge 02/21/2024 0801 by Burnard Almarie BROCKS, RN Outcome: Progressing   Problem: Bowel/Gastric: Goal: Gastrointestinal status for postoperative course will improve 02/21/2024 1419 by Burnard Almarie BROCKS, RN Outcome: Adequate for Discharge 02/21/2024 0801 by Burnard Almarie BROCKS, RN Outcome: Progressing   Problem: Cardiac: Goal: Ability to maintain an adequate cardiac output 02/21/2024 1419 by Burnard Almarie BROCKS,  RN Outcome: Adequate for Discharge 02/21/2024 0801 by Burnard Almarie BROCKS, RN Outcome: Progressing Goal: Will show no evidence of cardiac arrhythmias 02/21/2024 1419 by Burnard Almarie BROCKS, RN Outcome: Adequate for Discharge 02/21/2024 0801 by Burnard Almarie BROCKS, RN Outcome: Progressing   Problem: Nutritional:  Goal: Will attain and maintain optimal nutritional status 02/21/2024 1419 by Burnard Almarie BROCKS, RN Outcome: Adequate for Discharge 02/21/2024 0801 by Burnard Almarie BROCKS, RN Outcome: Progressing   Problem: Neurological: Goal: Will regain or maintain usual level of consciousness 02/21/2024 1419 by Burnard Almarie BROCKS, RN Outcome: Adequate for Discharge 02/21/2024 0801 by Burnard Almarie BROCKS, RN Outcome: Progressing   Problem: Clinical Measurements: Goal: Ability to maintain clinical measurements within normal limits 02/21/2024 1419 by Burnard Almarie BROCKS, RN Outcome: Adequate for Discharge 02/21/2024 0801 by Burnard Almarie BROCKS, RN Outcome: Progressing Goal: Postoperative complications will be avoided or minimized 02/21/2024 1419 by Burnard Almarie BROCKS, RN Outcome: Adequate for Discharge 02/21/2024 0801 by Burnard Almarie BROCKS, RN Outcome: Progressing   Problem: Respiratory: Goal: Will regain and/or maintain adequate ventilation 02/21/2024 1419 by Burnard Almarie BROCKS, RN Outcome: Adequate for Discharge 02/21/2024 0801 by Burnard Almarie BROCKS, RN Outcome: Progressing Goal: Respiratory status will improve 02/21/2024 1419 by Burnard Almarie BROCKS, RN Outcome: Adequate for Discharge 02/21/2024 0801 by Burnard Almarie BROCKS, RN Outcome: Progressing   Problem: Skin Integrity: Goal: Demonstrates signs of wound healing without infection 02/21/2024 1419 by Burnard Almarie BROCKS, RN Outcome: Adequate for Discharge 02/21/2024 0801 by Burnard Almarie BROCKS, RN Outcome: Progressing   Problem: Urinary Elimination: Goal: Will remain free from infection 02/21/2024 1419 by Burnard Almarie BROCKS, RN Outcome:  Adequate for Discharge 02/21/2024 0801 by Burnard Almarie BROCKS, RN Outcome: Progressing Goal: Ability to achieve and maintain adequate urine output 02/21/2024 1419 by Burnard Almarie BROCKS, RN Outcome: Adequate for Discharge 02/21/2024 0801 by Burnard Almarie BROCKS, RN Outcome: Progressing   "

## 2024-02-21 NOTE — Plan of Care (Signed)
 " Problem: Education: Goal: Knowledge of General Education information will improve Description: Including pain rating scale, medication(s)/side effects and non-pharmacologic comfort measures 02/21/2024 0801 by Burnard Almarie BROCKS, RN Outcome: Progressing 02/21/2024 0801 by Burnard Almarie BROCKS, RN Outcome: Progressing   Problem: Health Behavior/Discharge Planning: Goal: Ability to manage health-related needs will improve 02/21/2024 0801 by Burnard Almarie BROCKS, RN Outcome: Progressing 02/21/2024 0801 by Burnard Almarie BROCKS, RN Outcome: Progressing   Problem: Clinical Measurements: Goal: Ability to maintain clinical measurements within normal limits will improve 02/21/2024 0801 by Burnard Almarie BROCKS, RN Outcome: Progressing 02/21/2024 0801 by Burnard Almarie BROCKS, RN Outcome: Progressing Goal: Will remain free from infection 02/21/2024 0801 by Burnard Almarie BROCKS, RN Outcome: Progressing 02/21/2024 0801 by Burnard Almarie BROCKS, RN Outcome: Progressing Goal: Diagnostic test results will improve 02/21/2024 0801 by Burnard Almarie BROCKS, RN Outcome: Progressing 02/21/2024 0801 by Burnard Almarie BROCKS, RN Outcome: Progressing Goal: Respiratory complications will improve 02/21/2024 0801 by Burnard Almarie BROCKS, RN Outcome: Progressing 02/21/2024 0801 by Burnard Almarie BROCKS, RN Outcome: Progressing Goal: Cardiovascular complication will be avoided 02/21/2024 0801 by Burnard Almarie BROCKS, RN Outcome: Progressing 02/21/2024 0801 by Burnard Almarie BROCKS, RN Outcome: Progressing   Problem: Activity: Goal: Risk for activity intolerance will decrease 02/21/2024 0801 by Burnard Almarie BROCKS, RN Outcome: Progressing 02/21/2024 0801 by Burnard Almarie BROCKS, RN Outcome: Progressing   Problem: Nutrition: Goal: Adequate nutrition will be maintained 02/21/2024 0801 by Burnard Almarie BROCKS, RN Outcome: Progressing 02/21/2024 0801 by Burnard Almarie BROCKS, RN Outcome: Progressing   Problem: Coping: Goal: Level of anxiety will  decrease 02/21/2024 0801 by Burnard Almarie BROCKS, RN Outcome: Progressing 02/21/2024 0801 by Burnard Almarie BROCKS, RN Outcome: Progressing   Problem: Elimination: Goal: Will not experience complications related to bowel motility 02/21/2024 0801 by Burnard Almarie BROCKS, RN Outcome: Progressing 02/21/2024 0801 by Burnard Almarie BROCKS, RN Outcome: Progressing Goal: Will not experience complications related to urinary retention 02/21/2024 0801 by Burnard Almarie BROCKS, RN Outcome: Progressing 02/21/2024 0801 by Burnard Almarie BROCKS, RN Outcome: Progressing   Problem: Pain Managment: Goal: General experience of comfort will improve and/or be controlled 02/21/2024 0801 by Burnard Almarie BROCKS, RN Outcome: Progressing 02/21/2024 0801 by Burnard Almarie BROCKS, RN Outcome: Progressing   Problem: Safety: Goal: Ability to remain free from injury will improve 02/21/2024 0801 by Burnard Almarie BROCKS, RN Outcome: Progressing 02/21/2024 0801 by Burnard Almarie BROCKS, RN Outcome: Progressing   Problem: Skin Integrity: Goal: Risk for impaired skin integrity will decrease 02/21/2024 0801 by Burnard Almarie BROCKS, RN Outcome: Progressing 02/21/2024 0801 by Burnard Almarie BROCKS, RN Outcome: Progressing   Problem: Education: Goal: Knowledge of the prescribed therapeutic regimen will improve 02/21/2024 0801 by Burnard Almarie BROCKS, RN Outcome: Progressing 02/21/2024 0801 by Burnard Almarie BROCKS, RN Outcome: Progressing   Problem: Bowel/Gastric: Goal: Gastrointestinal status for postoperative course will improve 02/21/2024 0801 by Burnard Almarie BROCKS, RN Outcome: Progressing 02/21/2024 0801 by Burnard Almarie BROCKS, RN Outcome: Progressing   Problem: Cardiac: Goal: Ability to maintain an adequate cardiac output 02/21/2024 0801 by Burnard Almarie BROCKS, RN Outcome: Progressing 02/21/2024 0801 by Burnard Almarie BROCKS, RN Outcome: Progressing Goal: Will show no evidence of cardiac arrhythmias 02/21/2024 0801 by Burnard Almarie BROCKS,  RN Outcome: Progressing 02/21/2024 0801 by Burnard Almarie BROCKS, RN Outcome: Progressing   Problem: Nutritional: Goal: Will attain and maintain optimal nutritional status 02/21/2024 0801 by Burnard Almarie BROCKS, RN Outcome: Progressing 02/21/2024 0801 by Burnard Almarie BROCKS, RN Outcome: Progressing   Problem: Neurological: Goal: Will regain or maintain usual level of  consciousness 02/21/2024 0801 by Burnard Almarie BROCKS, RN Outcome: Progressing 02/21/2024 0801 by Burnard Almarie BROCKS, RN Outcome: Progressing   Problem: Clinical Measurements: Goal: Ability to maintain clinical measurements within normal limits 02/21/2024 0801 by Burnard Almarie BROCKS, RN Outcome: Progressing 02/21/2024 0801 by Burnard Almarie BROCKS, RN Outcome: Progressing Goal: Postoperative complications will be avoided or minimized 02/21/2024 0801 by Burnard Almarie BROCKS, RN Outcome: Progressing 02/21/2024 0801 by Burnard Almarie BROCKS, RN Outcome: Progressing   Problem: Respiratory: Goal: Will regain and/or maintain adequate ventilation 02/21/2024 0801 by Burnard Almarie BROCKS, RN Outcome: Progressing 02/21/2024 0801 by Burnard Almarie BROCKS, RN Outcome: Progressing Goal: Respiratory status will improve Outcome: Progressing   Problem: Skin Integrity: Goal: Demonstrates signs of wound healing without infection Outcome: Progressing   Problem: Urinary Elimination: Goal: Will remain free from infection Outcome: Progressing Goal: Ability to achieve and maintain adequate urine output Outcome: Progressing   "

## 2024-02-21 NOTE — Telephone Encounter (Signed)
 Patient is calling in she is leaving the hospital wants to make sure her antibiotics get called in to [facility]

## 2024-02-21 NOTE — Telephone Encounter (Signed)
 Per review of discharge orders, it appears doxycycline  was sent in to [facility]

## 2024-02-21 NOTE — Discharge Instructions (Signed)
 Activity (include date of return to work if known) As tolerated: NO showers NO driving No heavy activities  Diet:regular No restrictions:  Wound Care: Keep dressing clean & dry  Do not change dressings Special Instructions: Do not raise arms up Continue to empty, recharge, & record drainage from J-P drains &/or Hemovacs 2-3 times a day, as needed. Call Doctor if any unusual problems occur such as pain, excessive Bleeding, unrelieved Nausea/vomiting, Fever &/or chills When lying down, keep head elevated on 2-3 pillows or back-rest Follow-up appointment: Please call the office.  The patient received discharge instruction from:___________________________________________   Patient signature ________________________________________ / Date___________    Signature of individual providing instructions/ Date________________

## 2024-02-21 NOTE — Progress Notes (Signed)
 Patient discharged, instructions given. IV discontinued. Follow up and activity instructions given to patient and family. Patient left floor in wheel chair. Follow up appointment with doctors discussed. Instructions for activity signed by patient.

## 2024-02-21 NOTE — Progress Notes (Signed)
 1 Day Post-Op  Subjective: POD#1 Evacuation of breast hematoma, Exploration of breast pocket, washout and debridement.   Objective: Vital signs in last 24 hours: Temp:  [97.5 F (36.4 C)-98.5 F (36.9 C)] 97.7 F (36.5 C) (01/16 0409) Pulse Rate:  [51-88] 51 (01/16 0409) Resp:  [11-28] 20 (01/16 0409) BP: (131-202)/(65-153) 152/93 (01/16 0409) SpO2:  [90 %-100 %] 100 % (01/16 0409) Last BM Date : 02/20/24  Intake/Output from previous day: 01/15 0701 - 01/16 0700 In: 1953.4 [P.O.:560; I.V.:1143.4; IV Piggyback:250] Out: 660 [Drains:160; Blood:500] Intake/Output this shift: No intake/output data recorded. RN as chaperone Patient A&O x 3 Bilateral breast flaps look viable, yet bruised more on R than L. Dressing in place.  Jp's serosanguinous bilaterally. Adequate output.  No hematomas, seromas or signs of infection  Lab Results:  BMET Recent Labs    02/20/24 1413  NA 142  K 3.1*  CL 104  GLUCOSE 94  BUN 9  CREATININE 0.50    Anti-infectives: Anti-infectives (From admission, onward)    Start     Dose/Rate Route Frequency Ordered Stop   02/20/24 2200  ceFAZolin  (ANCEF ) IVPB 2g/100 mL premix        2 g 200 mL/hr over 30 Minutes Intravenous Every 8 hours 02/20/24 1808 02/27/24 2159   02/20/24 1545  ceFAZolin  1 g / gentamicin  80 mg in NS 500 mL surgical irrigation         Irrigation  Once 02/20/24 1532 02/20/24 1600   02/20/24 1530  polymyxin 500,000 units/gentamicin  80 mg/0.9 % sodium chloride  ophth. irrigation  Status:  Discontinued         Irrigation  Once 02/20/24 1524 02/20/24 1532       Assessment/Plan: s/p Procedures: EVACUATION HEMATOMA IRRIGATION AND DEBRIDEMENT  OOB to chair CBC and BMP this am Advance diet Discharge later today if stable.   LOS: 1 day    Adisen Bennion M Markeria Goetsch 02/21/2024

## 2024-02-21 NOTE — Discharge Summary (Signed)
 Physician Discharge Summary  Patient ID: Shawna Mcbride MRN: [id number] DOB/AGE: 1965/06/27 58 y.o.  Admit date: 02/20/2024 Discharge date: 02/21/2024  Admission Diagnoses:  Discharge Diagnoses:  Principal Problem:   S/P bilateral mastectomy   Discharged Condition: good  Hospital Course: Patient is a 59 year old female who presents on 02/20/2024 with a large right breast hematoma status post bilateral mastectomies with immediate reconstruction with tissue expanders by Dr. Ebbie and Dr. Waddell.  Patient was taken to the operating room, and the hematoma was evacuated, see operative report for more details.  This morning patient was examined remains hemodynamically normal no hematoma seroma or seromas on bilateral breasts this exam was performed with RN as chaperone.  JP drains with serosanguineous adequate output.  Hemoglobin today is 9.5, expected after episode of bleeding.  BMP within normal limits.  Patient meets criteria for discharge with close follow-up.  Consults: None  Significant Diagnostic Studies: labs: CBC and BMP  Discharge Exam: Blood pressure (!) 166/73, pulse (!) 53, temperature 97.9 F (36.6 C), temperature source Oral, resp. rate 17, height 5' 7 (1.702 m), SpO2 97%. RN as chaperone Alert oriented x 3, hemodynamically normal, on hypertensive side. Bilateral breasts with bruising but viable flaps, JPs serosanguineous with adequate output.  Incisions covered with Steri-Strips.  No signs of infection.  No hematomas or seromas noticed.  Disposition: Discharge disposition: 01-Home or Self Care       Discharge Instructions     Call MD for:  difficulty breathing, headache or visual disturbances   Complete by: As directed    Call MD for:  extreme fatigue   Complete by: As directed    Call MD for:  hives   Complete by: As directed    Call MD for:  persistant dizziness or light-headedness   Complete by: As directed    Call MD for:  persistant nausea and vomiting    Complete by: As directed    Call MD for:  redness, tenderness, or signs of infection (pain, swelling, redness, odor or green/yellow discharge around incision site)   Complete by: As directed    Call MD for:  severe uncontrolled pain   Complete by: As directed    Call MD for:  temperature >100.4   Complete by: As directed    Increase activity slowly   Complete by: As directed    No wound care   Complete by: As directed       Allergies as of 02/21/2024       Reactions   Nsaids Other (See Comments)   Ulcers, takes goody or BC   Sulfa Antibiotics Hives   Ferrlecit [na Ferric Gluc Cplx In Sucrose] Hives, Swelling   Only iron  infusion     Med Rec must be completed prior to using this [employer]       Signed: Jenna Ardoin M Sylar Voong 02/21/2024, 1:49 PM

## 2024-02-22 ENCOUNTER — Encounter (HOSPITAL_COMMUNITY): Payer: Self-pay

## 2024-02-24 ENCOUNTER — Telehealth: Payer: Self-pay

## 2024-02-24 NOTE — Telephone Encounter (Signed)
 Returned patients call. Patient indicated she had noticed this morning that she had redness around the steri-strip and moving medial of the right breast and feels slightly warm. Denies any drainage, diarrhea, fever, and nausea.  She has been constipated, but has taken Ducolax for relief and has only taken 2 oxycodone  since surgery. Advise as long she is taking the pain medication, she will need to continue taking the stool softner. She also mentioned she vomited last night, but thought is was from the grease in the chicken soup. Patient is supposed to upload photo of the right breast. Left breast output 27 ml, right 50 ml.  Called Louisa E. And advised patient's complaint. Alesha will call patient to see if she can come in at 12:30 to see Dr. Waddell.  He is aware of situation.

## 2024-02-24 NOTE — Transitions of Care (Post Inpatient/ED Visit) (Signed)
 "  02/24/2024  Name: Shawna Mcbride MRN: [id number] DOB: [birth year]  Today's TOC FU Call Status: Today's TOC FU Call Status:: Successful TOC FU Call Completed TOC FU Call Complete Date: 02/24/24  Patient's Name and Date of Birth confirmed. DOB, Name  Transition Care Management Follow-up Telephone Call Date of Discharge: 02/21/24 Discharge Facility: [facility]) Type of Discharge: Inpatient Admission Primary Inpatient Discharge Diagnosis:: evacuation of hematoma.  s/p bilateral mastectomies 02/18/2024. How have you been since you were released from the hospital?: Better Any questions or concerns?: Yes Patient Questions/Concerns:: She said that there is redness around the incision on her right side and she has contacted the plastic  surgeon's office and is waiting for a call back.  She said that she has been empying the 2 drains and recording the output.  02/23/2024- left drain: 15 ml and right drain: 20 ml.  02/24/2024 @ 0500- left drain: 12 ml and right drain: 20 ml.    She spoke about her multiple upcoming appointments and the challenges she faces dealing with a cancer diagnosis. I offered to refer her to a LCSW but she declined.  I told her that she can contact the clinic if she changed her mind and she was very appreciative. Patient Questions/Concerns Addressed: Other: (She has already contacted the surgeon's office.)  Items Reviewed: Did you receive and understand the discharge instructions provided?: Yes Medications obtained,verified, and reconciled?: Yes (Medications Reviewed) (She stated that Dr Leonce told her stop taking the Multivitamin and as well as the Vitamin E.   She didn't have any questions about the med regime) Any new allergies since your discharge?: No Dietary orders reviewed?: Yes Type of Diet Ordered:: heart healthy.  She stated she does not have much of an appetite and only eats a limited number of foods. Do you have support at home?: Yes People in Home [RPT]:  child(ren), adult Name of Support/Comfort Primary Source: She said her son is with her and her ex-husband is also there to help as needed  Medications Reviewed Today: Medications Reviewed Today     Reviewed by Marvis Bradley, RN (Case Manager) on 02/24/24 at 1236  Med List Status: <None>   Medication Order Taking? Sig Documenting Provider Last Dose Status Informant  cholecalciferol (VITAMIN D3) 25 MCG (1000 UNIT) tablet [id number] No Take 1,000 Units by mouth daily. [provider] 02/20/2024  9:00 AM Active Self, Pharmacy Records  doxycycline  (VIBRAMYCIN ) 100 MG capsule [id number]  Take 1 capsule (100 mg total) by mouth 2 (two) times daily for 14 days. Montorfano, Lisandro M, MD  Active   EPINEPHrine  0.3 mg/0.3 mL IJ SOAJ injection [id number] No Inject 0.3 mg into the muscle as needed for anaphylaxis. Zelaya, Oscar A, PA-C Unknown Active Self, Pharmacy Records           Med Note (SATTERFIELD, TEENA FORBES Schaumann Feb 20, 2024  9:53 PM)    escitalopram  (LEXAPRO ) 20 MG tablet [id number] No TAKE 1 TABLET BY MOUTH EVERY DAY Jaycee Greig PARAS, NP 02/20/2024  9:00 AM Active Self, Pharmacy Records  ferrous sulfate  325 (65 FE) MG tablet [id number] No Take 325 mg by mouth See admin instructions. Take one tablet by mouth every Monday Wednesday and Fridays [provider] 02/19/2024 Morning Active Self, Pharmacy Records           Med Note (SATTERFIELD, TEENA FORBES   Thu Feb 20, 2024  9:47 PM) Patient verified she is taking this medication  hydrOXYzine  (VISTARIL ) 50 MG capsule  [id number] No Take 1 capsule (50 mg total) by mouth every 8 (eight) hours as needed.  Patient taking differently: Take 50 mg by mouth every evening.   Jaycee Greig PARAS, NP 02/19/2024 Evening Active Self, Pharmacy Records  Misc Natural Products (OSTEO BI-FLEX TRIPLE STRENGTH) TABS [id number] No Take 0.5 tablets by mouth daily at 12 noon. [provider] 02/20/2024  9:00 AM Active Self, Pharmacy Records  Multiple Vitamins-Minerals  (MULTIVITAMIN WOMEN 50+) TABS [id number] No Take 1 tablet by mouth daily. [provider] Past Week Active Self, Pharmacy Records  ondansetron  (ZOFRAN -ODT) disintegrating tablet 4 mg [id number]   Waddell Leonce NOVAK, MD  Active   oxycodone  (OXY-IR) 5 MG capsule [id number]  Take 1 capsule (5 mg total) by mouth every 6 (six) hours as needed for up to 7 days. Dillingham, Estefana RAMAN, DO  Active   pantoprazole  (PROTONIX ) 40 MG tablet [id number] No Take 1 tablet (40 mg total) by mouth 2 (two) times daily before a meal. Elgergawy, Brayton RAMAN, MD 02/20/2024  9:00 AM Expired 02/20/24 2359 Self, Pharmacy Records  varenicline  (CHANTIX ) 0.5 MG tablet [id number] No TAKE 1 TABLET BY MOUTH EVERY DAY Jaycee Greig PARAS, NP 02/20/2024  9:00 AM Active Self, Pharmacy Records  vitamin E 180 MG (400 UNITS) capsule [id number] No Take 400 Units by mouth daily at 12 noon. [provider] Past Week Active Self, Pharmacy Records            Home Care and Equipment/Supplies: Were Home Health Services Ordered?: No Any new equipment or medical supplies ordered?: No  Functional Questionnaire: Do you need assistance with bathing/showering or dressing?: Yes (She said her son assists as needed) Do you need assistance with meal preparation?: No Do you need assistance with eating?: No Do you have difficulty maintaining continence: No Do you need assistance with getting out of bed/getting out of a chair/moving?: No Do you have difficulty managing or taking your medications?: No  Follow up appointments reviewed: PCP Follow-up appointment confirmed?: No MD Provider Line Number:[phone] Given: No (She said she will call to schedule an appointment because she is currently trying to deal with multiple appointments) Specialist Hospital Follow-up appointment confirmed?: Yes Date of Specialist follow-up appointment?: 02/26/24 Follow-Up Specialty Provider:: plastic surgeon.  2/52026- general surgery.  04/06/2024- oncology Do you  need transportation to your follow-up appointment?: No Do you understand care options if your condition(s) worsen?: Yes-patient verbalized understanding    SIGNATURE Slater Diesel, RN   "

## 2024-02-25 ENCOUNTER — Encounter: Admitting: Student

## 2024-02-25 ENCOUNTER — Telehealth: Payer: Self-pay | Admitting: Plastic Surgery

## 2024-02-25 ENCOUNTER — Ambulatory Visit: Admitting: Plastic Surgery

## 2024-02-25 VITALS — BP 142/86 | HR 82

## 2024-02-25 DIAGNOSIS — Z1501 Genetic susceptibility to malignant neoplasm of breast: Secondary | ICD-10-CM

## 2024-02-25 DIAGNOSIS — Z1502 Genetic susceptibility to malignant neoplasm of ovary: Secondary | ICD-10-CM

## 2024-02-25 DIAGNOSIS — Z9889 Other specified postprocedural states: Secondary | ICD-10-CM

## 2024-02-25 DIAGNOSIS — Z1509 Genetic susceptibility to other malignant neoplasm: Secondary | ICD-10-CM

## 2024-02-25 MED ORDER — METHOCARBAMOL 500 MG PO TABS: 500.0000 mg | ORAL_TABLET | Freq: Three times a day (TID) | ORAL | Status: DC | PRN

## 2024-02-25 NOTE — Addendum Note (Signed)
 Addended by: Devona Holmes on: 02/25/2024 02:26 PM   Modules accepted: Orders

## 2024-02-25 NOTE — Telephone Encounter (Signed)
 [address] [facility]   Please requesting muscle relaxer to be sent to pharmacy

## 2024-02-25 NOTE — Progress Notes (Addendum)
 Shawna Mcbride is 6 days postop from bilateral mastectomy with immediate placement of tissue expanders.  She returned to the OR on postop day 1 for a hematoma on the right side.  She called yesterday concerned that there is erythema on the right breast.  And is seen today for evaluation.  On examination she does have some erythema but it is right around the Steri-Strips.  I believe this is just irritation from the adhesive.  She is still on antibiotics.  She will continue these.  She has a follow-up with me scheduled for tomorrow which she will keep just so that I can evaluate her over a period of 24 hours.  After her appointment [name] called back requesting muscle relaxers.  This is not unreasonable for the first week after surgery.  Will send her a limited supply.

## 2024-02-26 ENCOUNTER — Other Ambulatory Visit: Payer: Self-pay

## 2024-02-26 ENCOUNTER — Ambulatory Visit: Admitting: Plastic Surgery

## 2024-02-26 ENCOUNTER — Telehealth: Payer: Self-pay | Admitting: Plastic Surgery

## 2024-02-26 VITALS — BP 158/89 | HR 93 | Wt 118.0 lb

## 2024-02-26 DIAGNOSIS — Z1509 Genetic susceptibility to other malignant neoplasm: Secondary | ICD-10-CM

## 2024-02-26 DIAGNOSIS — Z1501 Genetic susceptibility to malignant neoplasm of breast: Secondary | ICD-10-CM

## 2024-02-26 DIAGNOSIS — Z9889 Other specified postprocedural states: Secondary | ICD-10-CM

## 2024-02-26 MED ORDER — METHOCARBAMOL 500 MG PO TABS: 500.0000 mg | ORAL_TABLET | Freq: Three times a day (TID) | ORAL | 0 refills | Status: AC | PRN

## 2024-02-26 NOTE — Progress Notes (Signed)
 Shawna Mcbride returns today for reevaluation.  The right breast is unchanged other than the erythema around the Steri-Strips may be slightly decreased.  She did not bring her drain record however the output was significantly higher than the level needed to remove the drain when I saw her yesterday.  Dressings were changed for her.  She will continue on her antibiotics.  She will follow-up next week hopefully to have the drains removed.  Call for any questions or concerns.

## 2024-02-26 NOTE — Telephone Encounter (Signed)
 I placed the order with Dr. Waddell confirming. Script will be sent to Dr. Waddell for co-sign.

## 2024-02-26 NOTE — Telephone Encounter (Signed)
 Husband called and stated he was at the pharmacy to pick up pt's muscle relaxers and they are not there, please advise.

## 2024-02-27 ENCOUNTER — Telehealth: Payer: Self-pay | Admitting: Plastic Surgery

## 2024-02-27 ENCOUNTER — Inpatient Hospital Stay (HOSPITAL_COMMUNITY): Admitting: Registered Nurse

## 2024-02-27 ENCOUNTER — Observation Stay (HOSPITAL_COMMUNITY)
Admission: EM | Admit: 2024-02-27 | Discharge: 2024-02-28 | Disposition: A | Discharge status: Home / Self Care | Attending: Plastic Surgery | Admitting: Plastic Surgery

## 2024-02-27 ENCOUNTER — Encounter (HOSPITAL_COMMUNITY)
Admission: EM | Disposition: A | Discharge status: Home / Self Care | Payer: Self-pay | Source: Home / Self Care | Attending: Plastic Surgery

## 2024-02-27 ENCOUNTER — Encounter: Admitting: Plastic Surgery

## 2024-02-27 ENCOUNTER — Other Ambulatory Visit: Payer: Self-pay

## 2024-02-27 ENCOUNTER — Encounter (HOSPITAL_COMMUNITY): Payer: Self-pay

## 2024-02-27 DIAGNOSIS — N6489 Other specified disorders of breast: Principal | ICD-10-CM

## 2024-02-27 DIAGNOSIS — L7632 Postprocedural hematoma of skin and subcutaneous tissue following other procedure: Principal | ICD-10-CM | POA: Insufficient documentation

## 2024-02-27 DIAGNOSIS — R58 Hemorrhage, not elsewhere classified: Secondary | ICD-10-CM

## 2024-02-27 DIAGNOSIS — L7682 Other postprocedural complications of skin and subcutaneous tissue: Secondary | ICD-10-CM

## 2024-02-27 DIAGNOSIS — Z87891 Personal history of nicotine dependence: Secondary | ICD-10-CM | POA: Insufficient documentation

## 2024-02-27 DIAGNOSIS — Z9886 Personal history of breast implant removal: Secondary | ICD-10-CM | POA: Diagnosis not present

## 2024-02-27 LAB — CBC WITH DIFFERENTIAL/PLATELET
Abs Immature Granulocytes: 0.04 K/uL (ref 0.00–0.07)
Basophils Absolute: 0.1 K/uL (ref 0.0–0.1)
Basophils Relative: 1 %
Eosinophils Absolute: 0.2 K/uL (ref 0.0–0.5)
Eosinophils Relative: 2 %
HCT: 29.9 % — ABNORMAL LOW (ref 36.0–46.0)
Hemoglobin: 9.9 g/dL — ABNORMAL LOW (ref 12.0–15.0)
Immature Granulocytes: 0 %
Lymphocytes Relative: 13 %
Lymphs Abs: 1.3 K/uL (ref 0.7–4.0)
MCH: 30.9 pg (ref 26.0–34.0)
MCHC: 33.1 g/dL (ref 30.0–36.0)
MCV: 93.4 fL (ref 80.0–100.0)
Monocytes Absolute: 0.9 K/uL (ref 0.1–1.0)
Monocytes Relative: 9 %
Neutro Abs: 7.8 K/uL — ABNORMAL HIGH (ref 1.7–7.7)
Neutrophils Relative %: 75 %
Platelets: 443 K/uL — ABNORMAL HIGH (ref 150–400)
RBC: 3.2 MIL/uL — ABNORMAL LOW (ref 3.87–5.11)
RDW: 13.1 % (ref 11.5–15.5)
WBC: 10.3 K/uL (ref 4.0–10.5)
nRBC: 0 % (ref 0.0–0.2)

## 2024-02-27 LAB — POCT I-STAT, CHEM 8
BUN: 10 mg/dL (ref 6–20)
Calcium, Ion: 1.13 mmol/L — ABNORMAL LOW (ref 1.15–1.40)
Chloride: 104 mmol/L (ref 98–111)
Creatinine, Ser: 0.5 mg/dL (ref 0.44–1.00)
Glucose, Bld: 121 mg/dL — ABNORMAL HIGH (ref 70–99)
HCT: 18 % — ABNORMAL LOW (ref 36.0–46.0)
Hemoglobin: 6.1 g/dL — CL (ref 12.0–15.0)
Potassium: 3.9 mmol/L (ref 3.5–5.1)
Sodium: 140 mmol/L (ref 135–145)
TCO2: 22 mmol/L (ref 22–32)

## 2024-02-27 LAB — BASIC METABOLIC PANEL WITH GFR
Anion gap: 14 (ref 5–15)
BUN: 11 mg/dL (ref 6–20)
CO2: 23 mmol/L (ref 22–32)
Calcium: 8.6 mg/dL — ABNORMAL LOW (ref 8.9–10.3)
Chloride: 101 mmol/L (ref 98–111)
Creatinine, Ser: 0.57 mg/dL (ref 0.44–1.00)
GFR, Estimated: >60 mL/min
Glucose, Bld: 160 mg/dL — ABNORMAL HIGH (ref 70–99)
Potassium: 3.4 mmol/L — ABNORMAL LOW (ref 3.5–5.1)
Sodium: 138 mmol/L (ref 135–145)

## 2024-02-27 LAB — PREPARE RBC (CROSSMATCH)

## 2024-02-27 MED ORDER — HYDROMORPHONE HCL 1 MG/ML IJ SOLN
0.2500 mg | INTRAMUSCULAR | Status: DC | PRN
  Administered 2024-02-27: 0.5 mg via INTRAVENOUS

## 2024-02-27 MED ORDER — HEMOSTATIC AGENTS (NO CHARGE) OPTIME
TOPICAL | Status: DC | PRN
  Administered 2024-02-27: 1 via TOPICAL

## 2024-02-27 MED ORDER — CEFAZOLIN SODIUM-DEXTROSE 2-4 GM/100ML-% IV SOLN
INTRAVENOUS | Status: AC
  Filled 2024-02-27: qty 100

## 2024-02-27 MED ORDER — MORPHINE SULFATE 4 MG/ML IJ SOLN: 1.0000 mg | INTRAMUSCULAR | Status: DC | PRN

## 2024-02-27 MED ORDER — PROPOFOL 10 MG/ML IV BOLUS
INTRAVENOUS | Status: AC
  Filled 2024-02-27: qty 20

## 2024-02-27 MED ORDER — OXYCODONE-ACETAMINOPHEN 5-325 MG PO TABS
1.0000 | ORAL_TABLET | Freq: Four times a day (QID) | ORAL | Status: DC | PRN
  Administered 2024-02-28 (×2): 2 via ORAL
  Filled 2024-02-27: qty 1
  Filled 2024-02-27: qty 2

## 2024-02-27 MED ORDER — SODIUM CHLORIDE 0.9 % IV BOLUS
1000.0000 mL | Freq: Once | INTRAVENOUS | Status: AC
  Administered 2024-02-27: 1000 mL via INTRAVENOUS

## 2024-02-27 MED ORDER — FENTANYL CITRATE (PF) 100 MCG/2ML IJ SOLN
INTRAMUSCULAR | Status: AC
  Filled 2024-02-27: qty 2

## 2024-02-27 MED ORDER — SCOPOLAMINE 1 MG/3DAYS TD PT72
MEDICATED_PATCH | TRANSDERMAL | Status: AC
  Filled 2024-02-27: qty 1

## 2024-02-27 MED ORDER — LACTATED RINGERS IV SOLN: INTRAVENOUS | Status: DC | PRN

## 2024-02-27 MED ORDER — BACITRACIN ZINC 500 UNIT/GM EX OINT
TOPICAL_OINTMENT | CUTANEOUS | Status: AC
  Filled 2024-02-27: qty 28.35

## 2024-02-27 MED ORDER — STERILE WATER FOR IRRIGATION IR SOLN
Status: DC | PRN
  Administered 2024-02-27: 600 mL

## 2024-02-27 MED ORDER — PHENYLEPHRINE HCL-NACL 20-0.9 MG/250ML-% IV SOLN
INTRAVENOUS | Status: DC | PRN
  Administered 2024-02-27: 50 ug/min via INTRAVENOUS

## 2024-02-27 MED ORDER — PROPOFOL 10 MG/ML IV BOLUS
INTRAVENOUS | Status: DC | PRN
  Administered 2024-02-27: 150 mg via INTRAVENOUS
  Administered 2024-02-27: 50 mg via INTRAVENOUS

## 2024-02-27 MED ORDER — FENTANYL CITRATE (PF) 100 MCG/2ML IJ SOLN
INTRAMUSCULAR | Status: DC | PRN
  Administered 2024-02-27 (×2): 50 ug via INTRAVENOUS

## 2024-02-27 MED ORDER — MIDAZOLAM HCL 2 MG/2ML IJ SOLN
INTRAMUSCULAR | Status: AC
  Filled 2024-02-27: qty 2

## 2024-02-27 MED ORDER — HYDROMORPHONE HCL 1 MG/ML IJ SOLN
INTRAMUSCULAR | Status: AC
  Filled 2024-02-27: qty 1

## 2024-02-27 MED ORDER — ONDANSETRON HCL 4 MG/2ML IJ SOLN
INTRAMUSCULAR | Status: DC | PRN
  Administered 2024-02-27: 4 mg via INTRAVENOUS

## 2024-02-27 MED ORDER — MIDAZOLAM HCL 5 MG/5ML IJ SOLN
INTRAMUSCULAR | Status: DC | PRN
  Administered 2024-02-27 (×2): 1 mg via INTRAVENOUS

## 2024-02-27 MED ORDER — ONDANSETRON HCL 4 MG/2ML IJ SOLN: 4.0000 mg | Freq: Four times a day (QID) | INTRAMUSCULAR | Status: DC | PRN

## 2024-02-27 MED ORDER — CEFAZOLIN SODIUM-DEXTROSE 2-3 GM-%(50ML) IV SOLR
INTRAVENOUS | Status: DC | PRN
  Administered 2024-02-27: 2 g via INTRAVENOUS

## 2024-02-27 MED ORDER — LIDOCAINE HCL (CARDIAC) PF 100 MG/5ML IV SOSY
PREFILLED_SYRINGE | INTRAVENOUS | Status: DC | PRN
  Administered 2024-02-27: 60 mg via INTRAVENOUS

## 2024-02-27 MED ORDER — ACETAMINOPHEN 325 MG PO TABS: 650.0000 mg | ORAL_TABLET | ORAL | Status: DC | PRN

## 2024-02-27 MED ORDER — SODIUM CHLORIDE 0.9 % IV SOLN: INTRAVENOUS | Status: DC

## 2024-02-27 MED ORDER — DEXAMETHASONE SODIUM PHOSPHATE 4 MG/ML IJ SOLN
INTRAMUSCULAR | Status: DC | PRN
  Administered 2024-02-27: 5 mg via INTRAVENOUS

## 2024-02-27 MED ORDER — SCOPOLAMINE 1 MG/3DAYS TD PT72
MEDICATED_PATCH | TRANSDERMAL | Status: DC | PRN
  Administered 2024-02-27: 1 via TRANSDERMAL

## 2024-02-27 MED ORDER — ALBUMIN HUMAN 5 % IV SOLN: INTRAVENOUS | Status: DC | PRN

## 2024-02-27 MED ORDER — SUCCINYLCHOLINE CHLORIDE 20 MG/ML IJ SOLN
INTRAMUSCULAR | Status: DC | PRN
  Administered 2024-02-27: 100 mg via INTRAVENOUS

## 2024-02-27 MED ORDER — SODIUM CHLORIDE 0.9 % IV SOLN: INTRAVENOUS | Status: DC | PRN

## 2024-02-27 MED ORDER — MORPHINE SULFATE (PF) 2 MG/ML IV SOLN
4.0000 mg | Freq: Once | INTRAVENOUS | Status: AC
  Administered 2024-02-27: 4 mg via INTRAVENOUS
  Filled 2024-02-27: qty 2

## 2024-02-27 MED ORDER — MORPHINE SULFATE (PF) 2 MG/ML IV SOLN: 1.0000 mg | INTRAVENOUS | Status: DC | PRN

## 2024-02-27 MED ORDER — ACETAMINOPHEN 10 MG/ML IV SOLN
INTRAVENOUS | Status: DC | PRN
  Administered 2024-02-27: 750 mg via INTRAVENOUS

## 2024-02-27 MED ORDER — TRANEXAMIC ACID-NACL 1000-0.7 MG/100ML-% IV SOLN
INTRAVENOUS | Status: AC
  Filled 2024-02-27: qty 100

## 2024-02-27 MED ORDER — 0.9 % SODIUM CHLORIDE (POUR BTL) OPTIME
TOPICAL | Status: DC | PRN
  Administered 2024-02-27: 3000 mL

## 2024-02-27 MED ORDER — SODIUM CHLORIDE 0.9% IV SOLUTION: Freq: Once | INTRAVENOUS | Status: DC

## 2024-02-27 MED ORDER — CEFAZOLIN SODIUM-DEXTROSE 1-4 GM/50ML-% IV SOLN
1.0000 g | Freq: Three times a day (TID) | INTRAVENOUS | Status: DC
  Administered 2024-02-28: 1 g via INTRAVENOUS
  Filled 2024-02-27: qty 50

## 2024-02-27 MED ORDER — TRANEXAMIC ACID-NACL 1000-0.7 MG/100ML-% IV SOLN
INTRAVENOUS | Status: AC | PRN
  Administered 2024-02-27: 1000 mg via INTRAVENOUS

## 2024-02-27 NOTE — ED Provider Triage Note (Addendum)
 Emergency Medicine Provider Triage Evaluation Note  Shawna Mcbride , a 59 y.o. female  was evaluated in triage.  Pt complains of bilateral breast pain, recently had bilateral mastectomy's had to have revision secondary to complications, reports today with increased swelling particular to the right breast with bruising, and increased pain bilaterally.  She appears in severe pain, acute distress..  Review of Systems  Positive: As above Negative:   Physical Exam  BP (!) 159/106   Pulse (!) 114   Temp (!) 97.2 F (36.2 C)   Resp 18   Ht 5' 7 (1.702 m)   Wt 52.6 kg   SpO2 100%   BMI 18.17 kg/m  Gen:   Awake, no distress   Resp:  Normal effort  MSK:   Moves extremities without difficulty  Other:  Swelling and bruising appreciated to the right breast, there is no lack of drainage into the left JP shunt.  Medical Decision Making  Medically screening exam initiated at 5:52 PM.  Appropriate orders placed.  Shawna Mcbride was informed that the remainder of the evaluation will be completed by another provider, this initial triage assessment does not replace that evaluation, and the importance of remaining in the ED until their evaluation is complete.  Initial pain management, labs orders placed.Shawna Mcbride with Dr. Leonce Birmingham with plastic surgery, they will be consulting with patient here in the ED.   Shawna Dorn BROCKS, PA 02/27/24 1759    Shawna Dorn BROCKS, PA 02/27/24 1807

## 2024-02-27 NOTE — Anesthesia Preprocedure Evaluation (Addendum)
"                                    Anesthesia Evaluation  Patient identified by MRN, date of birth, ID band Patient awake    Reviewed: Allergy & Precautions, H&P , NPO status , Patient's Chart, lab work & pertinent test results  Airway Mallampati: II  TM Distance: >3 FB Neck ROM: Full    Dental no notable dental hx. (+) Edentulous Upper, Dental Advisory Given   Pulmonary former smoker   Pulmonary exam normal breath sounds clear to auscultation       Cardiovascular negative cardio ROS  Rhythm:Regular Rate:Normal     Neuro/Psych   Anxiety Depression    negative neurological ROS     GI/Hepatic Neg liver ROS,GERD  Medicated,,  Endo/Other  negative endocrine ROS    Renal/GU negative Renal ROS  negative genitourinary   Musculoskeletal  (+) Arthritis , Osteoarthritis,    Abdominal   Peds  Hematology  (+) Blood dyscrasia, anemia   Anesthesia Other Findings   Reproductive/Obstetrics negative OB ROS                              Anesthesia Physical Anesthesia Plan  ASA: 2 and emergent  Anesthesia Plan: General   Post-op Pain Management: Ofirmev  IV (intra-op)*   Induction: Intravenous, Rapid sequence and Cricoid pressure planned  PONV Risk Score and Plan: 4 or greater and Ondansetron , Dexamethasone  and Midazolam   Airway Management Planned: Oral ETT  Additional Equipment:   Intra-op Plan:   Post-operative Plan: Extubation in OR  Informed Consent: I have reviewed the patients History and Physical, chart, labs and discussed the procedure including the risks, benefits and alternatives for the proposed anesthesia with the patient or authorized representative who has indicated his/her understanding and acceptance.     Dental advisory given  Plan Discussed with: CRNA  Anesthesia Plan Comments:          Anesthesia Quick Evaluation  "

## 2024-02-27 NOTE — ED Triage Notes (Signed)
 Pt states left breast is not draining. Right breast is very swollen. Pt has bilateral JP drains. Axox4.

## 2024-02-27 NOTE — H&P (Signed)
 @LOGO @    Patient ID: Shawna Mcbride, female    DOB: 07-13-1965, 59 y.o.   MRN: [id number]  Chief Complaint  Patient presents with   Post-op Problem      ICD-10-CM   1. Breast hematoma  N64.89        History of Present Illness: Shawna Mcbride is a 59 y.o.  female  with a history of mastectomy January 14 for BRCA gene positivity.  Patient developed postoperative hematoma on January 15 was taken back to the operating room for evacuation of the hematoma controlled bleeding.  She called the office this afternoon and said that her drain was no longer functioning properly.  Several minutes later she called back and said the drain was full of blood.  She went to the emergency department for evaluation.  I saw her there and she has an obvious hematoma on the right side with fresh blood in the drain.  I discussed these findings with her and told her we needed to go to the operating room for evacuation of the hematoma and removal of the tissue expander.  She has requested that we proceed   The patient has not had problems with anesthesia.   Summary of Previous Visit: Seen in the office twice this week with no evidence of bleeding and no evidence of infection on the right side    PMH Significant for: Depression anxiety and BRCA gene mutation   Past Medical History: Allergies: Allergies[1]  Current Medications: Current Medications[2]  Past Medical Problems: Past Medical History:  Diagnosis Date   Anemia    Anxiety    Arthritis    Depression    GERD (gastroesophageal reflux disease)     Past Surgical History: Past Surgical History:  Procedure Laterality Date   ABDOMINAL HYSTERECTOMY     ESOPHAGOGASTRODUODENOSCOPY  01/02/2011   Procedure: ESOPHAGOGASTRODUODENOSCOPY (EGD);  Surgeon: Lamar LULLA Bunk, MD;  Location[facility] ENDOSCOPY;  Service: Endoscopy;  Laterality: N/A;  ok to do in endo unit, regular adult endoscope   ESOPHAGOGASTRODUODENOSCOPY Left 07/23/2023   Procedure: EGD  (ESOPHAGOGASTRODUODENOSCOPY);  Surgeon: Burnette Fallow, MD;  Location: [facility] ENDOSCOPY;  Service: Gastroenterology;  Laterality: Left;   HEMATOMA EVACUATION Right 02/20/2024   Procedure: EVACUATION HEMATOMA;  Surgeon: Montorfano, Lisandro M, MD;  Location: [facility] OR;  Service: Plastics;  Laterality: Right;   INCISION AND DRAINAGE OF WOUND Right 02/20/2024   Procedure: IRRIGATION AND DEBRIDEMENT;  Surgeon: Montorfano, Lisandro M, MD;  Location: [facility] OR;  Service: Plastics;  Laterality: Right;   TISSUE EXPANDER PLACEMENT Bilateral 02/19/2024   Procedure: INSERTION, TISSUE EXPANDER WITH FLEX HD;  Surgeon: Waddell Leonce NOVAK, MD;  Location: [facility];  Service: Plastics;  Laterality: Bilateral;   TOTAL MASTECTOMY Bilateral 02/19/2024   Procedure: MASTECTOMY, SIMPLE;  Surgeon: Ebbie Cough, MD;  Location: [facility];  Service: General;  Laterality: Bilateral;  GEN w/PEC BLOCK BILATERAL RISK REDUCING MASTECTOMIES    Social History: Social History   Socioeconomic History   Marital status: Legally Separated    Spouse name: Not on file   Number of children: Not on file   Years of education: Not on file   Highest education level: Not on file  Occupational History   Not on file  Tobacco Use   Smoking status: Former    Current packs/day: 0.50    Types: Cigarettes, E-cigarettes   Smokeless tobacco: Never  Vaping Use   Vaping status: Some Days   Substances: Nicotine   Substance and Sexual Activity  Alcohol use: Yes    Comment: at least 4 beers per week   Drug use: Yes    Types: Marijuana   Sexual activity: Yes  Other Topics Concern   Not on file  Social History Narrative   Not on file   Social Drivers of Health   Tobacco Use: Medium Risk (02/27/2024)   Patient History    Smoking Tobacco Use: Former    Smokeless Tobacco Use: Never    Passive Exposure: Not on file  Financial Resource Strain: Low Risk (08/06/2023)   Overall Financial Resource Strain (CARDIA)     Difficulty of Paying Living Expenses: Not hard at all  Food Insecurity: No Food Insecurity (02/20/2024)   Epic    Worried About Programme Researcher, Broadcasting/film/video in the Last Year: Never true    Ran Out of Food in the Last Year: Never true  Transportation Needs: No Transportation Needs (02/20/2024)   Epic    Lack of Transportation (Medical): No    Lack of Transportation (Non-Medical): No  Physical Activity: Insufficiently Active (08/06/2023)   Exercise Vital Sign    Days of Exercise per Week: 2 days    Minutes of Exercise per Session: 30 min  Stress: Stress Concern Present (08/06/2023)   [employer] of Occupational Health - Occupational Stress Questionnaire    Feeling of Stress: To some extent  Social Connections: Moderately Integrated (07/22/2023)   Social Connection and Isolation Panel    Frequency of Communication with Friends and Family: Three times a week    Frequency of Social Gatherings with Friends and Family: Three times a week    Attends Religious Services: More than 4 times per year    Active Member of Clubs or Organizations: Yes    Attends Banker Meetings: More than 4 times per year    Marital Status: Divorced  Intimate Partner Violence: Not At Risk (02/20/2024)   Epic    Fear of Current or Ex-Partner: No    Emotionally Abused: No    Physically Abused: No    Sexually Abused: No  Depression (PHQ2-9): Low Risk (09/09/2023)   Depression (PHQ2-9)    PHQ-2 Score: 0  Recent Concern: Depression (PHQ2-9) - High Risk (08/06/2023)   Depression (PHQ2-9)    PHQ-2 Score: 15  Alcohol Screen: Low Risk (08/06/2023)   Alcohol Screen    Last Alcohol Screening Score (AUDIT): 2  Housing: Low Risk (02/20/2024)   Epic    Unable to Pay for Housing in the Last Year: No    Number of Times Moved in the Last Year: 0    Homeless in the Last Year: No  Utilities: Not At Risk (02/20/2024)   Epic    Threatened with loss of utilities: No  Health Literacy: Adequate Health Literacy (08/06/2023)   B1300  Health Literacy    Frequency of need for help with medical instructions: Never    Family History: Family History  Problem Relation Age of Onset   Breast cancer Mother 82   Heart attack Maternal Grandfather 19 - 49   Breast cancer Maternal Grandmother 17 - 89   Stroke Half-Sister 49   Ovarian cancer Maternal Aunt 48 - 69   Cancer - Other Maternal Aunt        blood and/or bone cancer? treated with transfusion or transplant, not leukemia   Heart attack Maternal Uncle    Lung cancer Maternal Cousin 49       metastatic to brain   Anesthesia problems Neg Hx  Hypotension Neg Hx    Malignant hyperthermia Neg Hx    Pseudochol deficiency Neg Hx     Review of Systems: ROS  Physical Exam: Vital Signs BP (!) 159/106   Pulse (!) 114   Temp (!) 97.2 F (36.2 C)   Resp 18   Ht 5' 7 (1.702 m)   Wt 52.6 kg   SpO2 100%   BMI 18.17 kg/m   Physical Exam performed in the emergency department Constitutional:      General: Patient is anxious and in obvious discomfort from the bleeding on the right side of her chest    Appearance: Normal appearance. Not ill-appearing.  HENT:     Head: Normocephalic and atraumatic.  Eyes:     Pupils: Pupils are equal, round. Cardiovascular:     Rate and Rhythm: Normal rate.    Pulses: Normal pulses.  Pulmonary:     Effort: No respiratory distress or increased work of breathing.  Speaks in full sentences. Abdominal:     General: Abdomen is flat. No distension.   Musculoskeletal: Normal range of motion. No lower extremity swelling or edema. No varicosities.  Skin:    General: Skin is warm and dry.     Findings: No erythema or rash.  Neurological:     Mental Status: Alert and oriented to person, place, and time.  Psychiatric:        Mood and Affect: Anxious       Behavior: Rolling back-and-forth in bed Chest: Patient has an obvious hematoma on the right Hemi chest.  There is blood in the drain.  Assessment/Plan: The patient is scheduled for  evacuation of the hematoma with removal of the tissue expander and placement of drain.  With Dr. Waddell.  Risks, benefits, and alternatives of procedure discussed, questions answered and consent obtained.      Pictures obtained: No      Postoperative hematoma: Discussed with the patient who is in agreement that we should go to the operating room to remove the hematoma.  She understands I will remove the tissue expander at the same time as this will be the second time that was exposed.  She will be admitted to the hospital overnight for observation.   Electronically signed by: Leonce KATHEE Waddell, MD 02/27/2024 7:02 PM     [1]  Allergies Allergen Reactions   Nsaids Other (See Comments)    Ulcers, takes goody or BC   Sulfa Antibiotics Hives   Ferrlecit [Na Ferric Gluc Cplx In Sucrose] Hives and Swelling    Only iron  infusion  [2]  Current Facility-Administered Medications:    methocarbamol  (ROBAXIN ) tablet 500 mg, 500 mg, Oral, Q8H PRN,    ondansetron  (ZOFRAN -ODT) disintegrating tablet 4 mg, 4 mg, Oral, Once,   Current Outpatient Medications:    cholecalciferol (VITAMIN D3) 25 MCG (1000 UNIT) tablet, Take 1,000 Units by mouth daily., Disp: , Rfl:    doxycycline  (VIBRAMYCIN ) 100 MG capsule, Take 1 capsule (100 mg total) by mouth 2 (two) times daily for 14 days., Disp: 28 capsule, Rfl: 0   EPINEPHrine  0.3 mg/0.3 mL IJ SOAJ injection, Inject 0.3 mg into the muscle as needed for anaphylaxis., Disp: 1 each, Rfl: 0   escitalopram  (LEXAPRO ) 20 MG tablet, TAKE 1 TABLET BY MOUTH EVERY DAY, Disp: 90 tablet, Rfl: 0   ferrous sulfate  325 (65 FE) MG tablet, Take 325 mg by mouth See admin instructions. Take one tablet by mouth every Monday Wednesday and Fridays, Disp: , Rfl:  hydrOXYzine  (VISTARIL ) 50 MG capsule, Take 1 capsule (50 mg total) by mouth every 8 (eight) hours as needed. (Patient taking differently: Take 50 mg by mouth every evening.), Disp: 90 capsule, Rfl: 1   methocarbamol  (ROBAXIN )  500 MG tablet, Take 1 tablet (500 mg total) by mouth every 8 (eight) hours as needed for up to 15 doses for muscle spasms., Disp: 15 tablet, Rfl: 0   Misc Natural Products (OSTEO BI-FLEX TRIPLE STRENGTH) TABS, Take 0.5 tablets by mouth daily at 12 noon., Disp: , Rfl:    Multiple Vitamins-Minerals (MULTIVITAMIN WOMEN 50+) TABS, Take 1 tablet by mouth daily., Disp: , Rfl:    oxycodone  (OXY-IR) 5 MG capsule, Take 1 capsule (5 mg total) by mouth every 6 (six) hours as needed for up to 7 days., Disp: 28 capsule, Rfl: 0   pantoprazole  (PROTONIX ) 40 MG tablet, Take 1 tablet (40 mg total) by mouth 2 (two) times daily before a meal. (Patient not taking: Reported on 02/25/2024), Disp: 90 tablet, Rfl: 0   varenicline  (CHANTIX ) 0.5 MG tablet, TAKE 1 TABLET BY MOUTH EVERY DAY, Disp: 30 tablet, Rfl: 2   vitamin E 180 MG (400 UNITS) capsule, Take 400 Units by mouth daily at 12 noon., Disp: , Rfl:

## 2024-02-27 NOTE — Telephone Encounter (Signed)
 pt son called and said they were just leaving the house at 445pmand she is bleeding, Dr Waddell let them know she needs to go to the ED, son confirmed and stated they will send a message in my chart when they make it to the ED

## 2024-02-27 NOTE — Transfer of Care (Signed)
 Immediate Anesthesia Transfer of Care Note  Patient: Shawna Mcbride  Procedure(s) Performed: EVACUATION HEMATOMA AND REMOVAL OF TISSUE EXPANDER RIGHT BREAST (Right: Breast)  Patient Location: PACU  Anesthesia Type:General  Level of Consciousness: awake, alert , and oriented  Airway & Oxygen Therapy: Patient Spontanous Breathing  Post-op Assessment: Report given to RN and Post -op Vital signs reviewed and stable  Post vital signs: Reviewed and stable  Last Vitals:  Vitals Value Taken Time  BP 123/87 02/27/24 22:15  Temp    Pulse 98 02/27/24 22:15  Resp 21 02/27/24 22:15  SpO2 99 % 02/27/24 22:15  Vitals shown include unfiled device data.  Last Pain:  Vitals:   02/27/24 1849  PainSc: 7          Complications: No notable events documented.

## 2024-02-27 NOTE — Anesthesia Procedure Notes (Signed)
 Procedure Name: Intubation Date/Time: 02/27/2024 8:26 PM  Performed by: Celia Alan HERO, CRNAPre-anesthesia Checklist: Patient identified, Emergency Drugs available, Suction available, Timeout performed and Patient being monitored Patient Re-evaluated:Patient Re-evaluated prior to induction Oxygen Delivery Method: Circle system utilized Preoxygenation: Pre-oxygenation with 100% oxygen Induction Type: IV induction and Rapid sequence Laryngoscope Size: Miller and 2 Grade View: Grade I Tube type: Oral Tube size: 7.0 mm Number of attempts: 1 Airway Equipment and Method: Stylet Placement Confirmation: positive ETCO2, ETT inserted through vocal cords under direct vision and breath sounds checked- equal and bilateral Secured at: 21 cm Tube secured with: Tape Dental Injury: Teeth and Oropharynx as per pre-operative assessment

## 2024-02-27 NOTE — ED Triage Notes (Signed)
 Pt recently had her breast removed and is in severe pain. Pt also states her right side where her breast is swollen and it just happened on the way here.

## 2024-02-27 NOTE — ED Provider Notes (Addendum)
 " [facility] Provider Note   CSN: [id number] Arrival date & time: 02/27/24  1700     Patient presents with: Post-op Problem   Shawna Mcbride is a 59 y.o. female.   Patient is a 59 year old who had a recent breast surgery, bilateral mastectomies.  She said she had to go back and have a revision of the right breast.  Today she started having increased pain to the area.  She had significant enlargement over the last few hours of the right breast.  She has noted some bleeding into the drain.  She contacted the on-call plastic surgeon, Dr. Waddell who advised her to come to the ED for evaluation.       Prior to Admission medications  Medication Sig Start Date End Date Taking? Authorizing Provider  cholecalciferol (VITAMIN D3) 25 MCG (1000 UNIT) tablet Take 1,000 Units by mouth daily.    [provider]  doxycycline  (VIBRAMYCIN ) 100 MG capsule Take 1 capsule (100 mg total) by mouth 2 (two) times daily for 14 days. 02/21/24 03/06/24  Montorfano, Lisandro M, MD  EPINEPHrine  0.3 mg/0.3 mL IJ SOAJ injection Inject 0.3 mg into the muscle as needed for anaphylaxis. 10/14/23   Zelaya, Oscar A, PA-C  escitalopram  (LEXAPRO ) 20 MG tablet TAKE 1 TABLET BY MOUTH EVERY DAY 12/06/23   Jaycee Greig PARAS, NP  ferrous sulfate  325 (65 FE) MG tablet Take 325 mg by mouth See admin instructions. Take one tablet by mouth every Monday Wednesday and Fridays    [provider]  hydrOXYzine  (VISTARIL ) 50 MG capsule Take 1 capsule (50 mg total) by mouth every 8 (eight) hours as needed. Patient taking differently: Take 50 mg by mouth every evening. 09/09/23   Jaycee Greig PARAS, NP  methocarbamol  (ROBAXIN ) 500 MG tablet Take 1 tablet (500 mg total) by mouth every 8 (eight) hours as needed for up to 15 doses for muscle spasms. 02/26/24   Waddell Leonce NOVAK, MD  Misc Natural Products (OSTEO BI-FLEX TRIPLE STRENGTH) TABS Take 0.5 tablets by mouth daily at 12 noon.    [provider]  Multiple Vitamins-Minerals (MULTIVITAMIN WOMEN 50+) TABS Take 1 tablet by mouth daily.    [provider]  oxycodone  (OXY-IR) 5 MG capsule Take 1 capsule (5 mg total) by mouth every 6 (six) hours as needed for up to 7 days. 02/21/24 02/28/24  Dillingham, Estefana RAMAN, DO  pantoprazole  (PROTONIX ) 40 MG tablet Take 1 tablet (40 mg total) by mouth 2 (two) times daily before a meal. Patient not taking: Reported on 02/25/2024 07/23/23 02/20/24  Elgergawy, Brayton RAMAN, MD  varenicline  (CHANTIX ) 0.5 MG tablet TAKE 1 TABLET BY MOUTH EVERY DAY 02/18/24   Jaycee Greig PARAS, NP  vitamin E 180 MG (400 UNITS) capsule Take 400 Units by mouth daily at 12 noon.    [provider]    Allergies: Nsaids, Sulfa antibiotics, and Ferrlecit [na ferric gluc cplx in sucrose]    Review of Systems  Constitutional:  Negative for fever.  Respiratory:  Negative for shortness of breath.   Cardiovascular:        Right breast pain  Gastrointestinal:  Negative for abdominal pain, nausea and vomiting.  Skin:  Positive for wound.    Updated Vital Signs BP (!) 159/106   Pulse (!) 114   Temp (!) 97.2 F (36.2 C)   Resp 18   Ht 5' 7 (1.702 m)   Wt 52.6 kg   SpO2 100%  BMI 18.17 kg/m   Physical Exam Constitutional:      Appearance: She is well-developed.  HENT:     Head: Normocephalic and atraumatic.  Cardiovascular:     Rate and Rhythm: Tachycardia present.  Pulmonary:     Effort: Pulmonary effort is normal.     Breath sounds: Normal breath sounds.  Musculoskeletal:        General: Tenderness present.     Cervical back: Normal range of motion and neck supple.     Comments: Significant enlargement and ecchymosis to the right breast with generalized tenderness and firmness of the breast area.  There is bloody drainage into the JP drain.  Skin:    General: Skin is warm and dry.  Neurological:     Mental Status: She is alert and oriented to person, place, and time.     (all labs ordered  are listed, but only abnormal results are displayed) Labs Reviewed  BASIC METABOLIC PANEL WITH GFR - Abnormal; Notable for the following components:      Result Value   Potassium 3.4 (*)    Glucose, Bld 160 (*)    Calcium 8.6 (*)    All other components within normal limits  CBC WITH DIFFERENTIAL/PLATELET - Abnormal; Notable for the following components:   RBC 3.20 (*)    Hemoglobin 9.9 (*)    HCT 29.9 (*)    Platelets 443 (*)    Neutro Abs 7.8 (*)    All other components within normal limits  I-STAT CHEM 8, ED  TYPE AND SCREEN    EKG: None  Radiology: No results found.   Procedures   Medications Ordered in the ED  morphine  (PF) 2 MG/ML injection 4 mg (4 mg Intravenous Given 02/27/24 1849)  sodium chloride  0.9 % bolus 1,000 mL (1,000 mLs Intravenous New Bag/Given 02/27/24 1849)                                    Medical Decision Making Risk Decision regarding hospitalization.   Patient is 59 year old who presents with pain and swelling to her right breast.  She had a recent breast surgery.  Appears that she has a large amount of swelling to the breast with blood draining into the JP drain.  Dr. Waddell has been consulted and is coming to the ED for evaluation.  CBC shows a stable hemoglobin at 9.9.  Glucose is mildly elevated but otherwise labs are nonconcerning.  Dr. Waddell has evaluated patient and is taking the patient to the operating room for washout and bleeding control.     Final diagnoses:  Breast hematoma    ED Discharge Orders     None          Lenor Hollering, MD 02/27/24 DANIAL Lenor Hollering, MD 02/27/24 1856  "

## 2024-02-27 NOTE — Op Note (Addendum)
 DATE OF OPERATION: 02/27/2024  LOCATION: [facility] operating Room  PREOPERATIVE DIAGNOSIS: Hematoma right breast  POSTOPERATIVE DIAGNOSIS: Same  PROCEDURE: Evacuation of hematoma, removal of tissue expander, control bleeding  SURGEON: Marinell Birmingham, MD  ASSISTANT: Not applicable  EBL: 25 cc surgical blood loss, 500 cc hematoma   CONDITION: Stable  COMPLICATIONS: None  INDICATION: The patient, Shawna Mcbride, is a 59 y.o. female born on 11-Aug-1965, is here for treatment of a postoperative hematoma in the right breast.   PROCEDURE DETAILS:  The patient was seen prior to surgery and marked.  The IV antibiotics were given. The patient was taken to the operating room and given a general anesthetic. A standard time out was performed and all information was confirmed by those in the room. SCDs were placed.   The chest was prepped and draped in usual sterile manner.  A portion of the incision was opened and the Yankauer suction was placed to remove most of the blood.  Once the liquefied blood was removed the incision was opened and the clotted blood was removed.  There was between 450 and 500 cc of blood and serous fluid removed from the right chest.  The tissue expander and ADM were removed to allow for complete inspection of the surgical cavity.  The surgical cavity was then irrigated with saline and on inspection there was an obvious arterial vessel in the axilla bleeding.  This was controlled by oversewing the vessel with 3-0 Vicryl sutures.  The area was packed and the remainder of the wound was inspected for bleeding.  Bleeding was controlled with the electrocautery.  The packs were removed and no further bleeding was noted in the axilla.  TXA soaked sponges were then placed in the surgical cavity and allowed to dwell for 5 minutes.  These were then removed the cavity was irrigated and the surfaces coated with Surgicel powder.  This was allowed to dwell for another 5 minutes.  During this 5 minutes a 19  French round drain was placed in the wound and brought out through the previous stab incision.  The Surgicel powder was then irrigated from the wound and the incision closed.  The deep tissues were closed with interrupted 3-0 Monocryl sutures.  The dermis was closed with interrupted and running 3-0 Monocryl sutures.  The skin edges were approximated with running 4-0 Monocryl sutures.  The incision was sealed with Dermabond and a compressive dressing was applied.  The patient was awakened from anesthesia without incident and transferred to the recovery room in stable condition.  During the procedure a hemoglobin was obtained.  This was 6.1 and the decision was made to transfuse the patient in the recovery room. All instrument needle and sponge counts were reported as correct and there were no complications other than the noted hematoma. The patient was allowed to wake up and taken to recovery room in stable condition at the end of the case. The family was notified at the end of the case.   SABRA

## 2024-02-27 NOTE — Anesthesia Postprocedure Evaluation (Signed)
"   Anesthesia Post Note  Patient: Shawna Mcbride  Procedure(s) Performed: EVACUATION HEMATOMA AND REMOVAL OF TISSUE EXPANDER RIGHT BREAST (Right: Breast)     Patient location during evaluation: PACU Anesthesia Type: General Level of consciousness: awake and alert Pain management: pain level controlled Vital Signs Assessment: post-procedure vital signs reviewed and stable Respiratory status: spontaneous breathing, nonlabored ventilation and respiratory function stable Cardiovascular status: blood pressure returned to baseline and stable Postop Assessment: no apparent nausea or vomiting Anesthetic complications: no   No notable events documented.  Last Vitals:  Vitals:   02/27/24 2245 02/27/24 2300  BP: 108/77 125/84  Pulse: 84 84  Resp: (!) 23 17  Temp:    SpO2: 97% 99%    Last Pain:  Vitals:   02/27/24 2256  TempSrc:   PainSc: 5                  Benson Porcaro,W. EDMOND      "

## 2024-02-27 NOTE — Telephone Encounter (Signed)
 Pt's son called and stated that pt is able to hear hissing from tube, drain is filling up with air.no longer draining.

## 2024-02-28 ENCOUNTER — Encounter (HOSPITAL_COMMUNITY): Payer: Self-pay | Admitting: Plastic Surgery

## 2024-02-28 LAB — TYPE AND SCREEN
ABO/RH(D): B POS
Antibody Screen: NEGATIVE
Unit division: 0
Unit division: 0

## 2024-02-28 LAB — CBC
HCT: 28.4 % — ABNORMAL LOW (ref 36.0–46.0)
Hemoglobin: 9.8 g/dL — ABNORMAL LOW (ref 12.0–15.0)
MCH: 30.5 pg (ref 26.0–34.0)
MCHC: 34.5 g/dL (ref 30.0–36.0)
MCV: 88.5 fL (ref 80.0–100.0)
Platelets: 347 K/uL (ref 150–400)
RBC: 3.21 MIL/uL — ABNORMAL LOW (ref 3.87–5.11)
RDW: 16.1 % — ABNORMAL HIGH (ref 11.5–15.5)
WBC: 11.7 K/uL — ABNORMAL HIGH (ref 4.0–10.5)
nRBC: 0 % (ref 0.0–0.2)

## 2024-02-28 LAB — BPAM RBC
Blood Product Expiration Date: 202602152359
Blood Product Expiration Date: 202602152359
ISSUE DATE / TIME: 202601222211
ISSUE DATE / TIME: 202601222211
Unit Type and Rh: 7300
Unit Type and Rh: 7300

## 2024-02-28 LAB — POCT I-STAT, CHEM 8
BUN: 11 mg/dL (ref 6–20)
Calcium, Ion: 1.11 mmol/L — ABNORMAL LOW (ref 1.15–1.40)
Chloride: 104 mmol/L (ref 98–111)
Creatinine, Ser: 0.5 mg/dL (ref 0.44–1.00)
Glucose, Bld: 143 mg/dL — ABNORMAL HIGH (ref 70–99)
HCT: 31 % — ABNORMAL LOW (ref 36.0–46.0)
Hemoglobin: 10.5 g/dL — ABNORMAL LOW (ref 12.0–15.0)
Potassium: 3.3 mmol/L — ABNORMAL LOW (ref 3.5–5.1)
Sodium: 139 mmol/L (ref 135–145)
TCO2: 23 mmol/L (ref 22–32)

## 2024-02-28 NOTE — Progress Notes (Signed)
 Patient ID: Shawna Mcbride, female   DOB: [birth year], 59 y.o.   MRN: [id number] Patient doing well. Appreciate Dr Waddell and his team caring for her.  Discussed her pathology as all benign. Plan to see back in office

## 2024-02-28 NOTE — Plan of Care (Signed)
 Pt doing well. Pt given D/C instructions with verbal understanding. Pt D/C'd home with JP drains in place per MD order. Pt instructed on how to care for the drains at home, verbal understanding was provided. Pt's incision is clean and dry with no sign of infection. Pt's IV was removed prior to D/C. Pt D/C'd home via wheelchair per MD order. Pt is stable @ D/C and has no other needs at this time. Rosina Rakers, RN

## 2024-02-28 NOTE — Discharge Instructions (Signed)
 INSTRUCTIONS FOR AFTER BREAST SURGERY   You will likely have some questions about what to expect following your operation.  The following information will help you and your family understand what to expect when you are discharged from the hospital.  It is important to follow these guidelines to help ensure a smooth recovery and reduce complication.  Postoperative instructions include information on: diet, wound care, medications and physical activity.  AFTER SURGERY Expect to go home after the procedure.  In some cases, you may need to spend one night in the hospital for observation.  DIET Breast surgery does not require a specific diet.  However, the healthier you eat the better your body will heal. It is important to increasing your protein intake.  This means limiting the foods with sugar and carbohydrates.  Focus on vegetables and some meat.  If you have liposuction during your procedure be sure to drink water .  If your urine is bright yellow, then it is concentrated, and you need to drink more water .  As a general rule after surgery, you should have 8 ounces of water  every hour while awake.  If you find you are persistently nauseated or unable to take in liquids let us  know.  NO TOBACCO USE or EXPOSURE.  This will slow your healing process and lead to a wound.  WOUND CARE Leave the binder on at all times except when showering . Use fragrance free soap like Dial, Dove or Ivory.   You cannot shower / get surgical site wet while drains are still in place.  No baths, pools or hot tubs for four weeks. We close your incision to leave the smallest and best-looking scar. No ointment or creams on your incisions for four weeks.  No Neosporin (Too many skin reactions).  A few weeks after surgery you can use Mederma and start massaging the scar. We ask you to wear your binder or sports bra for the first 6 weeks around the clock, including while sleeping. This provides added comfort and helps reduce the  fluid accumulation at the surgery site. NO Ice or heating pads to the operative site.  You have a very high risk of a BURN before you feel the temperature change. Continue to empty, recharge, & record drainage from drains 2-3 times a day, as needed.   ACTIVITY No heavy lifting until cleared by the doctor.  This usually means no more than a half-gallon of milk.  It is OK to walk and climb stairs. Moving your legs is very important to decrease your risk of a blood clot.  It will also help keep you from getting deconditioned.  Every 1 to 2 hours get up and walk for 5 minutes. This will help with a quicker recovery back to normal.  Let pain be your guide so you don't do too much.  This time is for you to recover.  You will be more comfortable if you sleep and rest with your head elevated either with a few pillows under you or in a recliner.  No stomach sleeping for a three months.  WORK Everyone returns to work at different times. As a rough guide, most people take at least 1 - 2 weeks off prior to returning to work. If you need documentation for your job, give the forms to the front staff at the clinic.  DRIVING Arrange for someone to bring you home from the hospital after your surgery.  You may be able to drive a few days after surgery  but not while taking any narcotics or valium.  BOWEL MOVEMENTS Constipation can occur after anesthesia and while taking pain medication.  It is important to stay ahead for your comfort.  We recommend taking Milk of Magnesia (2 tablespoons; twice a day) while taking the pain pills.  MEDICATIONS You may be prescribed should start after surgery At your preoperative visit for you history and physical you may have been given the following medications: Zofran  4 mg:  This is to treat nausea and vomiting.  You can take this every 6 hours as needed and only if needed. Oxycodone  5 mg:  This is only to be used after you have taken the Motrin or the Tylenol . Every 8 hours as  needed.   Over the counter Medication to take: If you have additional pain then take 500 mg of the Tylenol  every 8 hours.  Only take the Norco after you have tried these two. MiraLAX  or Milk of Magnesia: Take this according to the bottle if you take the Norco.  WHEN TO CALL Call your surgeon's office if any of the following occur: Fever 101 degrees F or greater Excessive bleeding or fluid from the incision site. Pain that increases over time without aid from the medications Redness, warmth, or pus draining from incision sites Persistent nausea or inability to take in liquids Severe misshapen area that underwent the operation.  Here are some resources for breast cancer patients:  Plastic surgery website: https://www.plasticsurgery.org/for-medical-professionals/education-and-resources/publications/breast-reconstruction-magazine Breast Reconstruction Awareness Campaign:  chesscontest.fr Plastic surgery Implant information:  https://www.plasticsurgery.org/patient-safety/breast-implant-safety

## 2024-02-28 NOTE — Progress Notes (Signed)
 1 Day Post-Op  Subjective: Patient is a 59 year old female with history of breast cancer status post breast reconstruction.  She initially underwent bilateral mastectomies and placement of bilateral tissue expanders with Dr. Ebbie and Dr. Waddell on 02/19/2024.  She then developed a hematoma of the right breast and underwent evacuation of the hematoma with Dr. Montorfano on 02/20/2024.  Patient then developed another hematoma of the right breast and underwent evacuation of hematoma, removal of tissue expander and control bleeding with Dr. Waddell yesterday, 02/27/2024.  Patient was kept overnight for observation.  Upon entering the room this morning, patient is sitting upright in bed in no acute distress.  She reports she is doing well.  She denies any acute issues overnight.  She denies any lightheadedness or dizziness.  She reports that she has been eating and drinking without issue.  Reports she has been up and ambulating without issue.  Patient states that she is ready to go home today.  Objective: Vital signs in last 24 hours: Temp:  [97.2 F (36.2 C)-98.9 F (37.2 C)] 98.8 F (37.1 C) (01/23 0709) Pulse Rate:  [75-114] 75 (01/23 0709) Resp:  [13-28] 18 (01/23 0709) BP: (105-175)/(57-106) 140/76 (01/23 0709) SpO2:  [95 %-100 %] 98 % (01/23 0709) Weight:  [52.6 kg] 52.6 kg (01/22 1748)    Intake/Output from previous day: 01/22 0701 - 01/23 0700 In: 3874 [P.O.:1200; I.V.:1550; Blood:749; IV Piggyback:375] Out: 520 [Drains:70; Blood:450] Intake/Output this shift: No intake/output data recorded.  General appearance: alert, cooperative, and no distress Resp: Unlabored breathing, no respiratory distress Breasts: Expanders in place on the left side.  There is no overlying erythema, no fluid collection palpated on exam.  There is minimal serosanguineous drainage in the bulb.  To the right side, there is some irritation to the skin surrounding the incision.  There is mild tenderness to  palpation diffusely.  There is no sign of hematoma or seroma.  There is dark serosanguineous drainage in the bulb.  Lab Results:     Latest Ref Rng & Units 02/27/2024    9:56 PM 02/27/2024    6:02 PM 02/21/2024    9:18 AM  CBC  WBC 4.0 - 10.5 K/uL  10.3  9.0   Hemoglobin 12.0 - 15.0 g/dL 6.1  9.9  9.5   Hematocrit 36.0 - 46.0 % 18.0  29.9  28.2   Platelets 150 - 400 K/uL  443  225     BMET Recent Labs    02/27/24 1802 02/27/24 2156  NA 138 140  K 3.4* 3.9  CL 101 104  CO2 23  --   GLUCOSE 160* 121*  BUN 11 10  CREATININE 0.57 0.50  CALCIUM 8.6*  --    PT/INR No results for input(s): LABPROT, INR in the last 72 hours. ABG No results for input(s): PHART, HCO3 in the last 72 hours.  Invalid input(s): PCO2, PO2  Studies/Results: No results found.  Anti-infectives: Anti-infectives (From admission, onward)    Start     Dose/Rate Route Frequency Ordered Stop   02/28/24 0600  ceFAZolin  (ANCEF ) IVPB 1 g/50 mL premix        1 g 100 mL/hr over 30 Minutes Intravenous Every 8 hours 02/27/24 2356 02/28/24 2159       Assessment/Plan: s/p Procedures: EVACUATION HEMATOMA AND REMOVAL OF TISSUE EXPANDER RIGHT BREAST Plan: - Patient overall doing well since her surgery yesterday.  Discussed with her that she will need to have CBC done prior to discharge.  I called  the phlebotomy team to have them send someone to draw her labs. -Otherwise we will have the patient go home today.  Discussed with her the importance of wearing compression.  Patient states that she is not taking any blood thinners or NSAIDs.   -Will have her follow back up early next week in the clinic.  LOS: 1 day    Estefana FORBES Peck, PA-C 02/28/2024

## 2024-02-28 NOTE — Discharge Summary (Signed)
 Physician Discharge Summary  Patient ID: Shawna Mcbride MRN: [id number] DOB/AGE: Nov 30, 1965 58 y.o.  Admit date: 02/27/2024 Discharge date: 02/28/2024  Admission Diagnoses:  Discharge Diagnoses:  Principal Problem:   Breast hematoma Active Problems:   S/p breast implant removal   Discharged Condition: good  Hospital Course: Patient is a 59 year old female with history of breast cancer status post breast reconstruction. She initially underwent bilateral mastectomies and placement of bilateral tissue expanders with Dr. Ebbie and Dr. Waddell on 02/19/2024. She then developed a hematoma of the right breast and underwent evacuation of the hematoma with Dr. Montorfano on 02/20/2024. Patient then developed another hematoma of the right breast and underwent evacuation of hematoma, removal of tissue expander and control bleeding with Dr. Waddell yesterday, 02/27/2024. Patient was kept overnight for observation.   Patient was seen earlier today and was doing really well.  She was ready to go home.  We were waiting on some labs, her hemoglobin came back 9.8.  Will go ahead and discharge patient.  Discussed this with Dr. Waddell as well.  Consults: None  Significant Diagnostic Studies: labs: CBC  Treatments: surgery: Evacuation of right breast hematoma  Discharge Exam: Blood pressure (!) 140/76, pulse 75, temperature 98.8 F (37.1 C), resp. rate 18, height 5' 7 (1.702 m), weight 52.6 kg, SpO2 98%. General appearance: alert, cooperative, and no distress Resp: Unlabored breathing, no respiratory distress Breasts: Expanders in place on the left side.  There is no overlying erythema, no fluid collection palpated on exam.  There is minimal serosanguineous drainage in the bulb.  To the right side, there is some irritation to the skin surrounding the incision.  There is mild tenderness to palpation diffusely.  There is no sign of hematoma or seroma.  There is dark serosanguineous drainage in the  bulb.  Disposition:  Discharge disposition: 01-Home or Self Care       Discharge Instructions     (HEART FAILURE PATIENTS) Call MD:  Anytime you have any of the following symptoms: 1) 3 pound weight gain in 24 hours or 5 pounds in 1 week 2) shortness of breath, with or without a dry hacking cough 3) swelling in the hands, feet or stomach 4) if you have to sleep on extra pillows at night in order to breathe.   Complete by: As directed    Call MD for:  difficulty breathing, headache or visual disturbances   Complete by: As directed    Call MD for:  extreme fatigue   Complete by: As directed    Call MD for:  hives   Complete by: As directed    Call MD for:  persistant dizziness or light-headedness   Complete by: As directed    Call MD for:  persistant nausea and vomiting   Complete by: As directed    Call MD for:  redness, tenderness, or signs of infection (pain, swelling, redness, odor or green/yellow discharge around incision site)   Complete by: As directed    Call MD for:  severe uncontrolled pain   Complete by: As directed    Call MD for:  temperature >100.4   Complete by: As directed    Diet - low sodium heart healthy   Complete by: As directed    Increase activity slowly   Complete by: As directed       Allergies as of 02/28/2024       Reactions   Ferrlecit [na Ferric Gluc Cplx In Sucrose] Hives, Swelling   Only iron   infusion - high dose    Nsaids Other (See Comments)   Ulcers,   Sulfa Antibiotics Hives        Medication List     TAKE these medications    cholecalciferol 25 MCG (1000 UNIT) tablet Commonly known as: VITAMIN D3 Take 1,000 Units by mouth daily.   doxycycline  100 MG capsule Commonly known as: VIBRAMYCIN  Take 1 capsule (100 mg total) by mouth 2 (two) times daily for 14 days.   EPINEPHrine  0.3 mg/0.3 mL Soaj injection Commonly known as: EPI-PEN Inject 0.3 mg into the muscle as needed for anaphylaxis.   escitalopram  20 MG tablet Commonly  known as: LEXAPRO  TAKE 1 TABLET BY MOUTH EVERY DAY   ferrous sulfate  325 (65 FE) MG tablet Take 325 mg by mouth every Monday, Wednesday, and Friday.   hydrOXYzine  50 MG capsule Commonly known as: VISTARIL  Take 1 capsule (50 mg total) by mouth every 8 (eight) hours as needed. What changed: when to take this   methocarbamol  500 MG tablet Commonly known as: ROBAXIN  Take 1 tablet (500 mg total) by mouth every 8 (eight) hours as needed for up to 15 doses for muscle spasms.   Multivitamin Women 50+ Tabs Take 1 tablet by mouth daily.   Osteo Bi-Flex Triple Strength Tabs Take 0.5 tablets by mouth daily at 12 noon.   oxycodone  5 MG capsule Commonly known as: OXY-IR Take 1 capsule (5 mg total) by mouth every 6 (six) hours as needed for up to 7 days.   pantoprazole  40 MG tablet Commonly known as: PROTONIX  Take 1 tablet (40 mg total) by mouth 2 (two) times daily before a meal. What changed: when to take this   varenicline  0.5 MG tablet Commonly known as: CHANTIX  TAKE 1 TABLET BY MOUTH EVERY DAY   vitamin E 180 MG (400 UNITS) capsule Take 400 Units by mouth daily at 12 noon.        Follow-up Information     [facility] Follow up.   Specialty: Plastic Surgery Why: Follow up at your appointment next week Contact information: [address] [place]  [address] [phone]                [facility] [address] [place], [place] [address] [phone]  Signed: Estefana FORBES Peck 02/28/2024, 12:20 PM

## 2024-02-29 ENCOUNTER — Encounter: Payer: Self-pay | Admitting: Plastic Surgery

## 2024-03-04 ENCOUNTER — Telehealth: Payer: Self-pay

## 2024-03-04 NOTE — Transitions of Care (Post Inpatient/ED Visit) (Signed)
" ° °  03/04/2024  Name: Shawna Mcbride MRN: [id number] DOB: January 25, 1966  Today's TOC FU Call Status: Today's TOC FU Call Status:: Successful TOC FU Call Completed TOC FU Call Complete Date: 03/04/24  Patient's Name and Date of Birth confirmed. DOB, Name  Transition Care Management Follow-up Telephone Call Date of Discharge: 02/28/24 Discharge Facility: [facility]) Type of Discharge: Inpatient Admission Primary Inpatient Discharge Diagnosis:: breast hematoma How have you been since you were released from the hospital?: Better (She said she feels pretty good.) Any questions or concerns?: Yes Patient Questions/Concerns:: She said she is done with her antibiotic on Fri 03/06/2024 and is not sure if she needs to have it re-filled. She plans to discuss this with Dr Waddell at her appointment tomorrow, Patient Questions/Concerns Addressed: Other: (Patient to discuss with Dr Waddell)  Items Reviewed: Did you receive and understand the discharge instructions provided?: Yes Medications obtained,verified, and reconciled?: No Medications Not Reviewed Reasons:: Other: (She said she has all of her medications and did not need to review the med list. No new meds were ordered. She plans to speak to Dt Waddell tomorrow regarding the doxycycline ) Any new allergies since your discharge?: No Dietary orders reviewed?: Yes Type of Diet Ordered:: She said she is very picky about food and only eats a few select foods.  She stated she has an issue with food textures. Do you have support at home?: Yes People in Home [RPT]: child(ren), adult Name of Support/Comfort Primary Source: her son, Shawna Mcbride.  She said he has been a great help  Medications Reviewed Today: Medications Reviewed Today   Medications were not reviewed in this encounter     Home Care and Equipment/Supplies: Were Home Health Services Ordered?: No Any new equipment or medical supplies ordered?: No  Functional Questionnaire: Do you need  assistance with bathing/showering or dressing?: Yes (Her son assists.  She said he also assists her with managing her 2 drains.  She said she hopes to have the drains removed tomorrow at her appointment.) Do you need assistance with meal preparation?: No Do you need assistance with eating?: No Do you have difficulty maintaining continence: No Do you need assistance with getting out of bed/getting out of a chair/moving?: No Do you have difficulty managing or taking your medications?: No  Follow up appointments reviewed: PCP Follow-up appointment confirmed?: No MD Provider Line Number:[phone] Given: No (She said she will call the clinic to schedule an appointment when she is ready.) Specialist Hospital Follow-up appointment confirmed?: Yes Date of Specialist follow-up appointment?: 03/05/24 Follow-Up Specialty Provider:: plastic surgery.   03/12/2024- general surgery.  3/2 /2026- oncology Do you need transportation to your follow-up appointment?: No Do you understand care options if your condition(s) worsen?: Yes-patient verbalized understanding    SIGNATURE Slater Diesel, RN   "

## 2024-03-05 ENCOUNTER — Ambulatory Visit: Admitting: Plastic Surgery

## 2024-03-05 ENCOUNTER — Telehealth: Payer: Self-pay

## 2024-03-05 VITALS — BP 127/78 | HR 86

## 2024-03-05 DIAGNOSIS — Z9889 Other specified postprocedural states: Secondary | ICD-10-CM

## 2024-03-05 DIAGNOSIS — Z1509 Genetic susceptibility to other malignant neoplasm: Secondary | ICD-10-CM

## 2024-03-05 DIAGNOSIS — Z1505 Genetic susceptibility to malignant neoplasm of fallopian tube(s): Secondary | ICD-10-CM

## 2024-03-05 DIAGNOSIS — Z1501 Genetic susceptibility to malignant neoplasm of breast: Secondary | ICD-10-CM

## 2024-03-05 NOTE — Telephone Encounter (Signed)
 Call received from the patient.  She explained that she has been taking doxycycline  for a staph infection on her nose.  She explained that the area is on the bottom of her nose between her nostrils and is not open or draining now but it is not completely healed.  She finishes the course of doxycycline  tomorrow, 03/06/2024 and she is concerned about having a flare up and would like to continue treatment for the infection.  She said she spoke to Dr Waddell ( plastic surgery) at her appointment today and he said he could only prescribe bactrim but she said she has an allergy to that and he instructed her to speak to her PCP about other medication options.   I told her that Amy would need to see her before anything would be prescribed. The patient wants to be seen tomorrow.  There were no appointments available tomorrow. I offered her an appointment on Mon, 03/09/2024 @ [facility] but she declined stating she really wants to be seen tomorrow so she will go to Urgent Care.    Berwyn Leghorn also spoke to her and offered her the appointment for Monday but she declined.  She was very appreciative of the help but really wants to be seen tomorrow and said going to UC will not be a problem.   She also reported that her left breast drain was removed today and she hopes to have the right drain removed tomorrow.

## 2024-03-05 NOTE — Progress Notes (Signed)
 Shawna Mcbride returns today after bilateral mastectomy with tissue expander placement and return to the OR x 2 for bleeding.  She has had her right tissue expander removed as it was exposed twice due to the bleeding.  She is doing well with no specific complaints and is only taking Tylenol  or Motrin for discomfort. Her right drain is now down to around 30 mL/day and the left drain is well under 20 mL/day  On examination the incisions are clean dry and intact.  There is still some ecchymoses along the right lateral chest wall.  The left drain is removed today without difficulty.  She will return tomorrow to remove the left drain if it remains below 30 mL over the tonight and tomorrow morning.  She may begin showering tomorrow afternoon.

## 2024-03-06 ENCOUNTER — Ambulatory Visit: Admitting: Plastic Surgery

## 2024-03-06 ENCOUNTER — Telehealth: Payer: Self-pay | Admitting: Plastic Surgery

## 2024-03-06 DIAGNOSIS — Z1501 Genetic susceptibility to malignant neoplasm of breast: Secondary | ICD-10-CM

## 2024-03-06 DIAGNOSIS — Z1505 Genetic susceptibility to malignant neoplasm of fallopian tube(s): Secondary | ICD-10-CM

## 2024-03-06 DIAGNOSIS — Z9889 Other specified postprocedural states: Secondary | ICD-10-CM

## 2024-03-06 DIAGNOSIS — Z1509 Genetic susceptibility to other malignant neoplasm: Secondary | ICD-10-CM

## 2024-03-06 NOTE — Telephone Encounter (Signed)
 NOTED- Pt declined appointment and states she will be seen to at the urgent care tomorrow

## 2024-03-06 NOTE — Telephone Encounter (Signed)
 Pt called and stated that her drain is now only putting out around 4.5-5.  Please advise when we can take the drains out for her.

## 2024-03-06 NOTE — Telephone Encounter (Signed)
 Report to Emergency Department/Urgent Care/call 911 for immediate medical evaluation. Follow-up with Primary Care.

## 2024-03-06 NOTE — Progress Notes (Signed)
 Ms. Voshell returns today.  Her drain output has been less than 30 mL and she is ready to have the drain removed.  Drain is removed without difficulty.  Bandages applied.  She may begin showering.  Keep scheduled follow-up appointment next week, return sooner for any questions or concerns.

## 2024-03-09 ENCOUNTER — Telehealth: Admitting: Physician Assistant

## 2024-03-09 DIAGNOSIS — L989 Disorder of the skin and subcutaneous tissue, unspecified: Secondary | ICD-10-CM

## 2024-03-10 ENCOUNTER — Telehealth: Payer: Self-pay | Admitting: Family

## 2024-03-10 ENCOUNTER — Ambulatory Visit: Payer: Self-pay

## 2024-03-10 NOTE — Telephone Encounter (Signed)
Noted. Appt scheduled. 

## 2024-03-10 NOTE — Telephone Encounter (Signed)
 Pt confirmed appt
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
## 2024-03-11 ENCOUNTER — Encounter: Payer: Self-pay | Admitting: Plastic Surgery

## 2024-03-11 ENCOUNTER — Ambulatory Visit: Admitting: Plastic Surgery

## 2024-03-11 ENCOUNTER — Encounter: Payer: Self-pay | Admitting: Nurse Practitioner

## 2024-03-11 ENCOUNTER — Ambulatory Visit: Admitting: Nurse Practitioner

## 2024-03-11 VITALS — BP 151/92 | HR 75

## 2024-03-11 DIAGNOSIS — Z9889 Other specified postprocedural states: Secondary | ICD-10-CM

## 2024-03-11 DIAGNOSIS — F419 Anxiety disorder, unspecified: Secondary | ICD-10-CM

## 2024-03-11 DIAGNOSIS — Z1509 Genetic susceptibility to other malignant neoplasm: Secondary | ICD-10-CM

## 2024-03-11 DIAGNOSIS — Z15060 Genetic susceptibility to colorectal cancer: Secondary | ICD-10-CM

## 2024-03-11 DIAGNOSIS — Z1501 Genetic susceptibility to malignant neoplasm of breast: Secondary | ICD-10-CM

## 2024-03-11 MED ORDER — MUPIROCIN 2 % EX OINT: 1.0000 | TOPICAL_OINTMENT | Freq: Two times a day (BID) | CUTANEOUS | 1 refills | Status: AC

## 2024-03-11 MED ORDER — ESCITALOPRAM OXALATE 20 MG PO TABS: 20.0000 mg | ORAL_TABLET | Freq: Every day | ORAL | 0 refills | Status: AC

## 2024-03-11 MED ORDER — HYDROXYZINE PAMOATE 50 MG PO CAPS: 50.0000 mg | ORAL_CAPSULE | Freq: Three times a day (TID) | ORAL | 0 refills | Status: AC | PRN

## 2024-03-11 NOTE — Progress Notes (Signed)
 Shawna Mcbride returns today for evaluation.  She underwent bilateral mastectomies with placement of tissue expanders.  She required return to the OR twice for bleeding.  Since her second return to the OR she has done very well.  The right tissue expander is out and both Hemi chest are healing nicely.  Drains are out.  At this point she may slowly increase activity.  We discussed exercises for regaining range of motion in her shoulders and chest.  If she has difficulty with this after a week I have asked her to call me back and we will put her in for physical therapy.  Will plan on seeing her in 1 month.

## 2024-03-30 ENCOUNTER — Inpatient Hospital Stay

## 2024-04-01 ENCOUNTER — Other Ambulatory Visit (HOSPITAL_COMMUNITY)

## 2024-04-02 ENCOUNTER — Ambulatory Visit

## 2024-04-06 ENCOUNTER — Inpatient Hospital Stay

## 2024-04-08 ENCOUNTER — Encounter: Admitting: Plastic Surgery
